# Patient Record
Sex: Female | Born: 1960 | Race: White | Hispanic: No | Smoking: Former smoker
Health system: Southern US, Community
[De-identification: ages and names within clinical notes are randomized; demographics above are authoritative.]

## PROBLEM LIST (undated history)

## (undated) DIAGNOSIS — M419 Scoliosis, unspecified: Secondary | ICD-10-CM

## (undated) DIAGNOSIS — Z973 Presence of spectacles and contact lenses: Secondary | ICD-10-CM

## (undated) DIAGNOSIS — T8859XA Other complications of anesthesia, initial encounter: Secondary | ICD-10-CM

## (undated) DIAGNOSIS — Z860100 Personal history of colon polyps, unspecified: Secondary | ICD-10-CM

## (undated) DIAGNOSIS — T4145XA Adverse effect of unspecified anesthetic, initial encounter: Secondary | ICD-10-CM

## (undated) DIAGNOSIS — H409 Unspecified glaucoma: Secondary | ICD-10-CM

## (undated) DIAGNOSIS — S129XXA Fracture of neck, unspecified, initial encounter: Secondary | ICD-10-CM

## (undated) DIAGNOSIS — D122 Benign neoplasm of ascending colon: Secondary | ICD-10-CM

## (undated) DIAGNOSIS — M25562 Pain in left knee: Secondary | ICD-10-CM

## (undated) DIAGNOSIS — E039 Hypothyroidism, unspecified: Secondary | ICD-10-CM

## (undated) DIAGNOSIS — D649 Anemia, unspecified: Secondary | ICD-10-CM

## (undated) DIAGNOSIS — D125 Benign neoplasm of sigmoid colon: Secondary | ICD-10-CM

## (undated) DIAGNOSIS — Z1211 Encounter for screening for malignant neoplasm of colon: Secondary | ICD-10-CM

## (undated) DIAGNOSIS — M1990 Unspecified osteoarthritis, unspecified site: Secondary | ICD-10-CM

## (undated) DIAGNOSIS — R87610 Atypical squamous cells of undetermined significance on cytologic smear of cervix (ASC-US): Secondary | ICD-10-CM

## (undated) DIAGNOSIS — I341 Nonrheumatic mitral (valve) prolapse: Secondary | ICD-10-CM

## (undated) DIAGNOSIS — K219 Gastro-esophageal reflux disease without esophagitis: Secondary | ICD-10-CM

## (undated) HISTORY — DX: Pain in left knee: M25.562

## (undated) HISTORY — DX: Atypical squamous cells of undetermined significance on cytologic smear of cervix (ASC-US): R87.610

## (undated) HISTORY — DX: Personal history of colon polyps, unspecified: Z86.0100

## (undated) HISTORY — DX: Benign neoplasm of ascending colon: D12.2

## (undated) HISTORY — PX: COLPOSCOPY: SHX161

## (undated) HISTORY — PX: COLONOSCOPY: SHX174

## (undated) HISTORY — DX: Encounter for screening for malignant neoplasm of colon: Z12.11

## (undated) HISTORY — PX: MANDIBLE SURGERY: SHX707

## (undated) HISTORY — DX: Personal history of colonic polyps: Z86.010

## (undated) HISTORY — PX: FOOT SURGERY: SHX648

## (undated) HISTORY — DX: Benign neoplasm of sigmoid colon: D12.5

---
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
## 2005-04-30 ENCOUNTER — Ambulatory Visit: Payer: Self-pay | Admitting: Internal Medicine

## 2005-05-13 ENCOUNTER — Ambulatory Visit: Payer: Self-pay | Admitting: Internal Medicine

## 2005-07-20 ENCOUNTER — Ambulatory Visit: Payer: Self-pay

## 2006-01-29 ENCOUNTER — Ambulatory Visit: Payer: Self-pay | Admitting: Obstetrics and Gynecology

## 2006-05-10 ENCOUNTER — Ambulatory Visit: Payer: Self-pay | Admitting: Obstetrics and Gynecology

## 2007-07-28 ENCOUNTER — Ambulatory Visit: Payer: Self-pay | Admitting: Obstetrics and Gynecology

## 2007-10-13 ENCOUNTER — Ambulatory Visit: Payer: Self-pay | Admitting: Obstetrics and Gynecology

## 2007-10-21 ENCOUNTER — Ambulatory Visit: Payer: Self-pay | Admitting: Obstetrics and Gynecology

## 2008-07-31 ENCOUNTER — Ambulatory Visit: Payer: Self-pay | Admitting: Obstetrics and Gynecology

## 2008-08-07 ENCOUNTER — Ambulatory Visit: Payer: Self-pay | Admitting: Obstetrics and Gynecology

## 2009-08-30 ENCOUNTER — Ambulatory Visit: Payer: Self-pay

## 2010-09-02 ENCOUNTER — Ambulatory Visit: Payer: Self-pay | Admitting: Obstetrics and Gynecology

## 2015-03-08 ENCOUNTER — Other Ambulatory Visit: Payer: Self-pay | Admitting: Obstetrics and Gynecology

## 2015-03-08 DIAGNOSIS — Z13820 Encounter for screening for osteoporosis: Secondary | ICD-10-CM

## 2015-03-08 DIAGNOSIS — Z1231 Encounter for screening mammogram for malignant neoplasm of breast: Secondary | ICD-10-CM

## 2015-08-06 ENCOUNTER — Telehealth: Payer: Self-pay | Admitting: Gastroenterology

## 2015-08-06 NOTE — Telephone Encounter (Signed)
triage

## 2015-08-20 NOTE — Telephone Encounter (Signed)
LVM for pt to return my call.

## 2015-08-23 NOTE — Telephone Encounter (Signed)
No return call to schedule colonoscopy. Mailed letter.

## 2015-08-30 ENCOUNTER — Telehealth: Payer: Self-pay | Admitting: Gastroenterology

## 2015-08-30 NOTE — Telephone Encounter (Signed)
Patient received her letter in the mail for colonoscopy and would like for you to call her back.

## 2015-09-02 NOTE — Telephone Encounter (Signed)
LVM for pt to return my call on pt's cell and home phone.

## 2015-09-03 ENCOUNTER — Other Ambulatory Visit: Payer: Self-pay

## 2015-09-03 ENCOUNTER — Telehealth: Payer: Self-pay

## 2015-09-03 NOTE — Telephone Encounter (Signed)
Pt scheduled for screening colonoscopy at [facility] on 10/14/15. Please check insurance. Thanks!

## 2015-09-03 NOTE — Telephone Encounter (Signed)
Gastroenterology Pre-Procedure Review  Request Date: 10/14/15 Requesting Physician: Dr.   PATIENT REVIEW QUESTIONS: The patient responded to the following health history questions as indicated:    1. Are you having any GI issues? no 2. Do you have a personal history of Polyps?  No 3. Do you have a family history of Colon Cancer or Polyps? yes (Sister, colon mass removed) 4. Diabetes Mellitus? no 5. Joint replacements in the past 12 months?no 6. Major health problems in the past 3 months?no 7. Any artificial heart valves, MVP, or defibrillator?no    MEDICATIONS & ALLERGIES:    Patient reports the following regarding taking any anticoagulation/antiplatelet therapy:   Plavix, Coumadin, Eliquis, Xarelto, Lovenox, Pradaxa, Brilinta, or Effient? no Aspirin? no  Patient confirms/reports the following medications:  No current outpatient prescriptions on file.   No current facility-administered medications for this visit.    Patient confirms/reports the following allergies:  Allergies not on file  No orders of the defined types were placed in this encounter.    AUTHORIZATION INFORMATION Primary Insurance: 1D#: Group #:  Secondary Insurance: 1D#: Group #:  SCHEDULE INFORMATION: Date: 10/14/15 Time: Location: [place]

## 2015-10-11 ENCOUNTER — Encounter: Payer: Self-pay | Admitting: *Deleted

## 2015-10-18 NOTE — Discharge Instructions (Signed)

## 2015-10-21 ENCOUNTER — Encounter
Admission: RE | Disposition: A | Discharge status: Home / Self Care | Payer: Self-pay | Source: Ambulatory Visit | Attending: Gastroenterology

## 2015-10-21 ENCOUNTER — Encounter: Payer: Self-pay | Admitting: *Deleted

## 2015-10-21 ENCOUNTER — Ambulatory Visit
Admission: RE | Admit: 2015-10-21 | Discharge: 2015-10-21 | Disposition: A | Discharge status: Home / Self Care | Payer: BC Managed Care – PPO | Source: Ambulatory Visit | Attending: Gastroenterology | Admitting: Gastroenterology

## 2015-10-21 ENCOUNTER — Ambulatory Visit: Payer: BC Managed Care – PPO | Admitting: Anesthesiology

## 2015-10-21 DIAGNOSIS — Z882 Allergy status to sulfonamides status: Secondary | ICD-10-CM | POA: Diagnosis not present

## 2015-10-21 DIAGNOSIS — Z79899 Other long term (current) drug therapy: Secondary | ICD-10-CM | POA: Diagnosis not present

## 2015-10-21 DIAGNOSIS — Z1211 Encounter for screening for malignant neoplasm of colon: Secondary | ICD-10-CM | POA: Diagnosis not present

## 2015-10-21 DIAGNOSIS — D122 Benign neoplasm of ascending colon: Secondary | ICD-10-CM | POA: Diagnosis not present

## 2015-10-21 DIAGNOSIS — Z7951 Long term (current) use of inhaled steroids: Secondary | ICD-10-CM | POA: Insufficient documentation

## 2015-10-21 DIAGNOSIS — D125 Benign neoplasm of sigmoid colon: Secondary | ICD-10-CM | POA: Diagnosis not present

## 2015-10-21 DIAGNOSIS — F17210 Nicotine dependence, cigarettes, uncomplicated: Secondary | ICD-10-CM | POA: Insufficient documentation

## 2015-10-21 DIAGNOSIS — Z9889 Other specified postprocedural states: Secondary | ICD-10-CM | POA: Insufficient documentation

## 2015-10-21 DIAGNOSIS — Z880 Allergy status to penicillin: Secondary | ICD-10-CM | POA: Insufficient documentation

## 2015-10-21 DIAGNOSIS — M419 Scoliosis, unspecified: Secondary | ICD-10-CM | POA: Insufficient documentation

## 2015-10-21 DIAGNOSIS — K219 Gastro-esophageal reflux disease without esophagitis: Secondary | ICD-10-CM | POA: Insufficient documentation

## 2015-10-21 DIAGNOSIS — M19072 Primary osteoarthritis, left ankle and foot: Secondary | ICD-10-CM | POA: Insufficient documentation

## 2015-10-21 DIAGNOSIS — Z885 Allergy status to narcotic agent status: Secondary | ICD-10-CM | POA: Insufficient documentation

## 2015-10-21 DIAGNOSIS — K641 Second degree hemorrhoids: Secondary | ICD-10-CM | POA: Insufficient documentation

## 2015-10-21 DIAGNOSIS — M19071 Primary osteoarthritis, right ankle and foot: Secondary | ICD-10-CM | POA: Diagnosis not present

## 2015-10-21 DIAGNOSIS — E039 Hypothyroidism, unspecified: Secondary | ICD-10-CM | POA: Diagnosis not present

## 2015-10-21 DIAGNOSIS — I341 Nonrheumatic mitral (valve) prolapse: Secondary | ICD-10-CM | POA: Insufficient documentation

## 2015-10-21 HISTORY — PX: COLONOSCOPY WITH PROPOFOL: SHX5780

## 2015-10-21 HISTORY — DX: Nonrheumatic mitral (valve) prolapse: I34.1

## 2015-10-21 HISTORY — DX: Anemia, unspecified: D64.9

## 2015-10-21 HISTORY — DX: Presence of spectacles and contact lenses: Z97.3

## 2015-10-21 HISTORY — DX: Unspecified osteoarthritis, unspecified site: M19.90

## 2015-10-21 HISTORY — DX: Fracture of neck, unspecified, initial encounter: S12.9XXA

## 2015-10-21 HISTORY — DX: Gastro-esophageal reflux disease without esophagitis: K21.9

## 2015-10-21 HISTORY — PX: POLYPECTOMY: SHX5525

## 2015-10-21 HISTORY — DX: Hypothyroidism, unspecified: E03.9

## 2015-10-21 HISTORY — DX: Scoliosis, unspecified: M41.9

## 2015-10-21 SURGERY — COLONOSCOPY WITH PROPOFOL
Anesthesia: Monitor Anesthesia Care | Wound class: Contaminated

## 2015-10-21 MED ORDER — PROPOFOL 10 MG/ML IV BOLUS
INTRAVENOUS | Status: DC | PRN
  Administered 2015-10-21: 40 mg via INTRAVENOUS
  Administered 2015-10-21: 30 mg via INTRAVENOUS
  Administered 2015-10-21: 50 mg via INTRAVENOUS
  Administered 2015-10-21: 100 mg via INTRAVENOUS
  Administered 2015-10-21: 20 mg via INTRAVENOUS
  Administered 2015-10-21: 40 mg via INTRAVENOUS
  Administered 2015-10-21 (×2): 50 mg via INTRAVENOUS

## 2015-10-21 MED ORDER — ACETAMINOPHEN 325 MG PO TABS: 325.0000 mg | ORAL_TABLET | ORAL | Status: DC | PRN

## 2015-10-21 MED ORDER — LIDOCAINE HCL (CARDIAC) 20 MG/ML IV SOLN
INTRAVENOUS | Status: DC | PRN
  Administered 2015-10-21: 50 mg via INTRAVENOUS

## 2015-10-21 MED ORDER — ACETAMINOPHEN 160 MG/5ML PO SOLN: 325.0000 mg | ORAL | Status: DC | PRN

## 2015-10-21 MED ORDER — LACTATED RINGERS IV SOLN
INTRAVENOUS | Status: DC
  Administered 2015-10-21: 08:00:00 via INTRAVENOUS

## 2015-10-21 MED ORDER — STERILE WATER FOR IRRIGATION IR SOLN
Status: DC | PRN
  Administered 2015-10-21: 09:00:00

## 2015-10-21 MED ORDER — ONDANSETRON HCL 4 MG/2ML IJ SOLN: 4.0000 mg | Freq: Once | INTRAMUSCULAR | Status: DC | PRN

## 2015-10-21 SURGICAL SUPPLY — 28 items
CANISTER SUCT 1200ML W/VALVE (MISCELLANEOUS) ×4 IMPLANT
FCP ESCP3.2XJMB 240X2.8X (MISCELLANEOUS)
FORCEPS BIOP RAD 4 LRG CAP 4 (CUTTING FORCEPS) IMPLANT
FORCEPS BIOP RJ4 240 W/NDL (MISCELLANEOUS)
FORCEPS ESCP3.2XJMB 240X2.8X (MISCELLANEOUS) IMPLANT
GOWN CVR UNV OPN BCK APRN NK (MISCELLANEOUS) ×4 IMPLANT
GOWN ISOL THUMB LOOP REG UNIV (MISCELLANEOUS) ×4
HEMOCLIP INSTINCT (CLIP) IMPLANT
INJECTOR VARIJECT VIN23 (MISCELLANEOUS) IMPLANT
KIT CO2 TUBING (TUBING) IMPLANT
KIT DEFENDO VALVE AND CONN (KITS) IMPLANT
KIT ENDO PROCEDURE OLY (KITS) ×4 IMPLANT
LIGATOR MULTIBAND 6SHOOTER MBL (MISCELLANEOUS) IMPLANT
MARKER SPOT ENDO TATTOO 5ML (MISCELLANEOUS) IMPLANT
PAD GROUND ADULT SPLIT (MISCELLANEOUS) IMPLANT
SNARE SHORT THROW 13M SML OVAL (MISCELLANEOUS) ×4 IMPLANT
SNARE SHORT THROW 30M LRG OVAL (MISCELLANEOUS) IMPLANT
SPOT EX ENDOSCOPIC TATTOO (MISCELLANEOUS)
SUCTION POLY TRAP 4CHAMBER (MISCELLANEOUS) IMPLANT
TRAP SUCTION POLY (MISCELLANEOUS) ×8 IMPLANT
TUBING CONN 6MMX3.1M (TUBING)
TUBING SUCTION CONN 0.25 STRL (TUBING) IMPLANT
UNDERPAD 30X60 958B10 (PK) (MISCELLANEOUS) IMPLANT
VALVE BIOPSY ENDO (VALVE) IMPLANT
VARIJECT INJECTOR VIN23 (MISCELLANEOUS)
WATER AUXILLARY (MISCELLANEOUS) IMPLANT
WATER STERILE IRR 250ML POUR (IV SOLUTION) ×4 IMPLANT
WATER STERILE IRR 500ML POUR (IV SOLUTION) IMPLANT

## 2015-10-21 NOTE — Anesthesia Preprocedure Evaluation (Signed)
Anesthesia Evaluation  Patient identified by MRN, date of birth, ID band Patient awake    Reviewed: Allergy & Precautions, H&P , NPO status   Airway Mallampati: II  TM Distance: >3 FB Neck ROM: full    Dental   Pulmonary Current Smoker,    Pulmonary exam normal        Cardiovascular Normal cardiovascular exam     Neuro/Psych    GI/Hepatic GERD  ,  Endo/Other  Hypothyroidism   Renal/GU      Musculoskeletal   Abdominal   Peds  Hematology   Anesthesia Other Findings   Reproductive/Obstetrics                             Anesthesia Physical Anesthesia Plan  ASA: II  Anesthesia Plan: MAC   Post-op Pain Management:    Induction:   Airway Management Planned:   Additional Equipment:   Intra-op Plan:   Post-operative Plan:   Informed Consent: I have reviewed the patients History and Physical, chart, labs and discussed the procedure including the risks, benefits and alternatives for the proposed anesthesia with the patient or authorized representative who has indicated his/her understanding and acceptance.     Plan Discussed with: CRNA  Anesthesia Plan Comments:         Anesthesia Quick Evaluation

## 2015-10-21 NOTE — Anesthesia Postprocedure Evaluation (Signed)
Anesthesia Post Note  Patient: Brooke Lindsey  Procedure(s) Performed: Procedure(s) (LRB): COLONOSCOPY WITH PROPOFOL (N/A) POLYPECTOMY  Patient location during evaluation: PACU Anesthesia Type: MAC Level of consciousness: awake and alert Pain management: pain level controlled Vital Signs Assessment: post-procedure vital signs reviewed and stable Respiratory status: spontaneous breathing, nonlabored ventilation, respiratory function stable and patient connected to nasal cannula oxygen Cardiovascular status: stable and blood pressure returned to baseline Anesthetic complications: no    Amaryllis Dyke

## 2015-10-21 NOTE — Op Note (Signed)
[facility] Gastroenterology Patient Name: Brooke Lindsey Procedure Date: 10/21/2015 8:35 AM MRN: [id number] Account #: [id number] Date of Birth: 04-26-1961 Admit Type: Outpatient Age: 55 Room: [facility] OR ROOM 01 Gender: Female Note Status: Finalized Procedure:            Colonoscopy Indications:          Screening for colorectal malignant neoplasm Providers:            Lucilla Lame, MD Referring MD:         Lavera Guise, MD (Referring MD) Medicines:            Propofol per Anesthesia Complications:        No immediate complications. Procedure:            Pre-Anesthesia Assessment:                       - Prior to the procedure, a History and Physical was                        performed, and patient medications and allergies were                        reviewed. The patient's tolerance of previous                        anesthesia was also reviewed. The risks and benefits of                        the procedure and the sedation options and risks were                        discussed with the patient. All questions were                        answered, and informed consent was obtained. Prior                        Anticoagulants: The patient has taken no previous                        anticoagulant or antiplatelet agents. ASA Grade                        Assessment: II - A patient with mild systemic disease.                        After reviewing the risks and benefits, the patient was                        deemed in satisfactory condition to undergo the                        procedure.                       After obtaining informed consent, the colonoscope was                        passed under direct vision. Throughout the procedure,  the patient's blood pressure, pulse, and oxygen                        saturations were monitored continuously. The Olympus CF                        H180AL colonoscope (S#: [id number]) was introduced through                     the anus and advanced to the the cecum, identified by                        appendiceal orifice and ileocecal valve. The                        colonoscopy was performed without difficulty. The                        patient tolerated the procedure well. The quality of                        the bowel preparation was excellent. Findings:      The perianal and digital rectal examinations were normal.      A 5 mm polyp was found in the sigmoid colon. The polyp was sessile. The       polyp was removed with a cold biopsy forceps. Resection and retrieval       were complete.      A 7 mm polyp was found in the sigmoid colon. The polyp was sessile. The       polyp was removed with a cold snare. Resection and retrieval were       complete.      A 12 mm polyp was found in the ascending colon. The polyp was sessile.       The polyp was removed with a cold snare. Resection and retrieval were       complete.      Non-bleeding internal hemorrhoids were found during retroflexion. The       hemorrhoids were Grade II (internal hemorrhoids that prolapse but reduce       spontaneously). Impression:           - One 5 mm polyp in the sigmoid colon, removed with a                        cold biopsy forceps. Resected and retrieved.                       - One 7 mm polyp in the sigmoid colon, removed with a                        cold snare. Resected and retrieved.                       - One 12 mm polyp in the ascending colon, removed with                        a cold snare. Resected and retrieved.                       - Non-bleeding internal hemorrhoids. Recommendation:       -  Await pathology results.                       - Repeat colonoscopy in 5 years if polyp adenoma and 10                        years if hyperplastic Procedure Code(s):    --- Professional ---                       [phone], Colonoscopy, flexible; with removal of tumor(s),                        polyp(s), or other  lesion(s) by snare technique                       45380, 14, Colonoscopy, flexible; with biopsy, single                        or multiple Diagnosis Code(s):    --- Professional ---                       Z12.11, Encounter for screening for malignant neoplasm                        of colon                       D12.5, Benign neoplasm of sigmoid colon                       D12.2, Benign neoplasm of ascending colon CPT copyright 2016 American Medical Association. All rights reserved. The codes documented in this report are preliminary and upon coder review may  be revised to meet current compliance requirements. Lucilla Lame, MD 10/21/2015 9:04:06 AM This report has been signed electronically. Number of Addenda: 0 Note Initiated On: 10/21/2015 8:35 AM Scope Withdrawal Time: 0 hours 7 minutes 21 seconds  Total Procedure Duration: 0 hours 12 minutes 43 seconds       [facility]

## 2015-10-21 NOTE — H&P (Signed)
  [facility]  [address], [place], [place] [address] Phone: [phone] Fax : [phone]  Primary Care Physician:  Lavera Guise, MD Primary Gastroenterologist:  Dr. Allen Norris  Pre-Procedure History & Physical: HPI:  Brooke Lindsey is a 55 y.o. female is here for a screening colonoscopy.   Past Medical History  Diagnosis Date  . GERD (gastroesophageal reflux disease)     occasional - OTC meds  . Hypothyroidism     in past - no meds or issues over 10 yrs  . Arthritis     feet  . Compression fracture of C-spine (HCC)     C4-C5 - 30 yrs ago - no limitations or issues  . Scoliosis     mild - lower back  . Anemia     when younger  . Mitral valve prolapse     followed by PCP  . Wears contact lenses     Past Surgical History  Procedure Laterality Date  . Foot surgery    . Colposcopy    . Colonoscopy    . Cesarean section    . Mandible surgery      Prior to Admission medications   Medication Sig Start Date End Date Taking? Authorizing Provider  buPROPion (WELLBUTRIN) 75 MG tablet Take 75 mg by mouth 2 (two) times daily.   Yes Historical Provider, MD  fluticasone (FLONASE) 50 MCG/ACT nasal spray Place 2 sprays into both nostrils daily. 07/20/15  Yes Historical Provider, MD  folic acid (FOLVITE) A999333 MCG tablet Take 400 mcg by mouth 2 (two) times daily.   Yes Historical Provider, MD    Allergies as of 09/03/2015 - never reviewed  Allergen Reaction Noted  . Hydrocodone Nausea And Vomiting 09/03/2015  . Penicillins Hives and Itching 09/03/2015  . Sulfa antibiotics Nausea And Vomiting 09/03/2015    History reviewed. No pertinent family history.  Social History   Social History  . Marital Status: Married    Spouse Name: N/A  . Number of Children: N/A  . Years of Education: N/A   Occupational History  . Not on file.   Social History Main Topics  . Smoking status: Current Some Day Smoker    Types: Cigarettes  . Smokeless tobacco: Not on file   Comment: 1/2 PPWeek, off and on for 35 yrs  . Alcohol Use: 2.4 oz/week    4 Cans of beer per week  . Drug Use: Not on file  . Sexual Activity: Not on file   Other Topics Concern  . Not on file   Social History Narrative    Review of Systems: See HPI, otherwise negative ROS  Physical Exam: BP 99/72 mmHg  Pulse 73  Temp(Src) 97.9 F (36.6 C)  Resp 16  Ht 5\' 1"  (1.549 m)  Wt 124 lb (56.246 kg)  BMI 23.44 kg/m2  SpO2 95% General:   Alert,  pleasant and cooperative in NAD Head:  Normocephalic and atraumatic. Neck:  Supple; no masses or thyromegaly. Lungs:  Clear throughout to auscultation.    Heart:  Regular rate and rhythm. Abdomen:  Soft, nontender and nondistended. Normal bowel sounds, without guarding, and without rebound.   Neurologic:  Alert and  oriented x4;  grossly normal neurologically.  Impression/Plan: Brooke Lindsey is now here to undergo a screening colonoscopy.  Risks, benefits, and alternatives regarding colonoscopy have been reviewed with the patient.  Questions have been answered.  All parties agreeable.

## 2015-10-21 NOTE — Anesthesia Procedure Notes (Signed)
Procedure Name: MAC Date/Time: 10/21/2015 8:45 AM Performed by: Cameron Ali Pre-anesthesia Checklist: Patient identified, Emergency Drugs available, Suction available, Timeout performed and Patient being monitored Patient Re-evaluated:Patient Re-evaluated prior to inductionOxygen Delivery Method: Nasal cannula Placement Confirmation: positive ETCO2

## 2015-10-21 NOTE — Transfer of Care (Signed)
Immediate Anesthesia Transfer of Care Note  Patient: Brooke Lindsey  Procedure(s) Performed: Procedure(s): COLONOSCOPY WITH PROPOFOL (N/A) POLYPECTOMY  Patient Location: PACU  Anesthesia Type: MAC  Level of Consciousness: awake, alert  and patient cooperative  Airway and Oxygen Therapy: Patient Spontanous Breathing and Patient connected to supplemental oxygen  Post-op Assessment: Post-op Vital signs reviewed, Patient's Cardiovascular Status Stable, Respiratory Function Stable, Patent Airway and No signs of Nausea or vomiting  Post-op Vital Signs: Reviewed and stable  Complications: No apparent anesthesia complications

## 2015-10-22 ENCOUNTER — Encounter: Payer: Self-pay | Admitting: Gastroenterology

## 2015-10-28 ENCOUNTER — Encounter: Payer: Self-pay | Admitting: Gastroenterology

## 2016-07-06 ENCOUNTER — Other Ambulatory Visit: Payer: Self-pay | Admitting: Obstetrics and Gynecology

## 2016-07-06 DIAGNOSIS — Z1231 Encounter for screening mammogram for malignant neoplasm of breast: Secondary | ICD-10-CM

## 2017-03-26 ENCOUNTER — Ambulatory Visit (INDEPENDENT_AMBULATORY_CARE_PROVIDER_SITE_OTHER): Payer: BC Managed Care – PPO | Admitting: Family Medicine

## 2017-03-26 ENCOUNTER — Encounter: Payer: Self-pay | Admitting: Family Medicine

## 2017-03-26 VITALS — BP 112/82 | HR 64 | Ht 61.0 in | Wt 124.0 lb

## 2017-03-26 DIAGNOSIS — K429 Umbilical hernia without obstruction or gangrene: Secondary | ICD-10-CM

## 2017-03-26 DIAGNOSIS — Z7689 Persons encountering health services in other specified circumstances: Secondary | ICD-10-CM | POA: Diagnosis not present

## 2017-03-26 NOTE — Patient Instructions (Signed)
Umbilical Hernia, Adult A hernia is a bulge of tissue that pushes through an opening between muscles. An umbilical hernia happens in the abdomen, near the belly button (umbilicus). The hernia may contain tissues from the small intestine, large intestine, or fatty tissue covering the intestines (omentum). Umbilical hernias in adults tend to get worse over time, and they require surgical treatment. There are several types of umbilical hernias. You may have:  A hernia located just above or below the umbilicus (indirect hernia). This is the most common type of umbilical hernia in adults.  A hernia that forms through an opening formed by the umbilicus (direct hernia).  A hernia that comes and goes (reducible hernia). A reducible hernia may be visible only when you strain, lift something heavy, or cough. This type of hernia can be pushed back into the abdomen (reduced).  A hernia that traps abdominal tissue inside the hernia (incarcerated hernia). This type of hernia cannot be reduced.  A hernia that cuts off blood flow to the tissues inside the hernia (strangulated hernia). The tissues can start to die if this happens. This type of hernia requires emergency treatment.  What are the causes? An umbilical hernia happens when tissue inside the abdomen presses on a weak area of the abdominal muscles. What increases the risk? You may have a greater risk of this condition if you:  Are obese.  Have had several pregnancies.  Have a buildup of fluid inside your abdomen (ascites).  Have had surgery that weakens the abdominal muscles.  What are the signs or symptoms? The main symptom of this condition is a painless bulge at or near the belly button. A reducible hernia may be visible only when you strain, lift something heavy, or cough. Other symptoms may include:  Dull pain.  A feeling of pressure.  Symptoms of a strangulated hernia may include:  Pain that gets increasingly worse.  Nausea and  vomiting.  Pain when pressing on the hernia.  Skin over the hernia becoming red or purple.  Constipation.  Blood in the stool.  How is this diagnosed? This condition may be diagnosed based on:  A physical exam. You may be asked to cough or strain while standing. These actions increase the pressure inside your abdomen and force the hernia through the opening in your muscles. Your health care provider may try to reduce the hernia by pressing on it.  Your symptoms and medical history.  How is this treated? Surgery is the only treatment for an umbilical hernia. Surgery for a strangulated hernia is done as soon as possible. If you have a small hernia that is not incarcerated, you may need to lose weight before having surgery. Follow these instructions at home:  Lose weight, if told by your health care provider.  Do not try to push the hernia back in.  Watch your hernia for any changes in color or size. Tell your health care provider if any changes occur.  You may need to avoid activities that increase pressure on your hernia.  Do not lift anything that is heavier than 10 lb (4.5 kg) until your health care provider says that this is safe.  Take over-the-counter and prescription medicines only as told by your health care provider.  Keep all follow-up visits as told by your health care provider. This is important. Contact a health care provider if:  Your hernia gets larger.  Your hernia becomes painful. Get help right away if:  You develop sudden, severe pain near the   area of your hernia.  You have pain as well as nausea or vomiting.  You have pain and the skin over your hernia changes color.  You develop a fever. This information is not intended to replace advice given to you by your health care provider. Make sure you discuss any questions you have with your health care provider. Document Released: 12/27/2015 Document Revised: 03/29/2016 Document Reviewed:  12/27/2015 Elsevier Interactive Patient Education  2018 Elsevier Inc.  

## 2017-03-26 NOTE — Progress Notes (Signed)
Name: Brooke Lindsey   MRN: [id number]    DOB: Dec 25, 1960   Date:03/26/2017       Progress Note  Subjective  Chief Complaint  Chief Complaint  Patient presents with  . Establish Care    Changing PCP due to relocating  . Hernia    lifting boxes- was given diclofenac from next care to rub on abdomen but wanted her to follow up with PCP    Abdominal Pain  This is a new problem. The current episode started in the past 7 days. The onset quality is sudden. The problem occurs daily. The problem has been waxing and waning. The pain is located in the periumbilical region. The pain is mild. The quality of the pain is aching. The abdominal pain does not radiate. Pertinent negatives include no anorexia, arthralgias, belching, constipation, diarrhea, dysuria, fever, flatus, frequency, headaches, hematochezia, hematuria, melena, myalgias, nausea, vomiting or weight loss. Exacerbated by: lifting. Relieved by: ibuprofen. Treatments tried: nsaid. The treatment provided no relief. There is no history of abdominal surgery, colon cancer, Crohn's disease, gallstones, GERD, irritable bowel syndrome, pancreatitis, PUD or ulcerative colitis.    No problem-specific Assessment & Plan notes found for this encounter.   Past Medical History:  Diagnosis Date  . Anemia    when younger  . Arthritis    feet  . Compression fracture of C-spine (HCC)    C4-C5 - 30 yrs ago - no limitations or issues  . GERD (gastroesophageal reflux disease)    occasional - OTC meds  . Hypothyroidism    in past - no meds or issues over 10 yrs  . Mitral valve prolapse    followed by PCP  . Scoliosis    mild - lower back  . Wears contact lenses     Past Surgical History:  Procedure Laterality Date  . CESAREAN SECTION     x 2  . COLONOSCOPY    . COLONOSCOPY WITH PROPOFOL N/A 10/21/2015   Procedure: COLONOSCOPY WITH PROPOFOL;  Surgeon: Lucilla Lame, MD;  Location: [facility];  Service: Endoscopy;  Laterality: N/A;  .  COLPOSCOPY    . FOOT SURGERY    . MANDIBLE SURGERY    . POLYPECTOMY  10/21/2015   Procedure: POLYPECTOMY;  Surgeon: Lucilla Lame, MD;  Location: [place];  Service: Endoscopy;;    Family History  Problem Relation Age of Onset  . COPD Mother   . Stroke Mother   . Cancer Maternal Grandmother   . Heart disease Maternal Grandfather   . Hypertension Maternal Grandfather   . Diabetes Paternal Grandmother   . Heart disease Paternal Grandmother   . Stroke Paternal Grandmother   . Heart disease Paternal Grandfather   . Hypertension Paternal Grandfather     Social History   Social History  . Marital status: Married    Spouse name: N/A  . Number of children: N/A  . Years of education: N/A   Occupational History  . Not on file.   Social History Main Topics  . Smoking status: Former Smoker    Types: Cigarettes  . Smokeless tobacco: Never Used     Comment: 1/2 PPWeek, off and on for 35 yrs  . Alcohol use 2.4 oz/week    4 Cans of beer per week  . Drug use: No  . Sexual activity: Yes   Other Topics Concern  . Not on file   Social History Narrative  . No narrative on file    Allergies  Allergen Reactions  .  Hydrocodone Nausea And Vomiting  . Penicillins Hives and Itching  . Sulfa Antibiotics Nausea And Vomiting    Outpatient Medications Prior to Visit  Medication Sig Dispense Refill  . buPROPion (WELLBUTRIN) 75 MG tablet Take 75 mg by mouth 2 (two) times daily.    . fluticasone (FLONASE) 50 MCG/ACT nasal spray Place 2 sprays into both nostrils daily.  2  . folic acid (FOLVITE) 673 MCG tablet Take 400 mcg by mouth 2 (two) times daily.     No facility-administered medications prior to visit.     Review of Systems  Constitutional: Negative for chills, fever, malaise/fatigue and weight loss.  HENT: Negative for ear discharge, ear pain and sore throat.   Eyes: Negative for blurred vision.  Respiratory: Negative for cough, sputum production, shortness of breath  and wheezing.   Cardiovascular: Negative for chest pain, palpitations and leg swelling.  Gastrointestinal: Positive for abdominal pain. Negative for anorexia, blood in stool, constipation, diarrhea, flatus, heartburn, hematochezia, melena, nausea and vomiting.  Genitourinary: Negative for dysuria, frequency, hematuria and urgency.  Musculoskeletal: Negative for arthralgias, back pain, joint pain, myalgias and neck pain.  Skin: Negative for rash.  Neurological: Negative for dizziness, tingling, sensory change, focal weakness and headaches.  Endo/Heme/Allergies: Negative for environmental allergies and polydipsia. Does not bruise/bleed easily.  Psychiatric/Behavioral: Negative for depression and suicidal ideas. The patient is not nervous/anxious and does not have insomnia.      Objective  Vitals:   03/26/17 1530  BP: 112/82  Pulse: 64  Weight: 124 lb (56.2 kg)  Height: 5\' 1"  (1.549 m)    Physical Exam  Constitutional: She is well-developed, well-nourished, and in no distress. No distress.  HENT:  Head: Normocephalic and atraumatic.  Right Ear: External ear normal.  Left Ear: External ear normal.  Nose: Nose normal.  Mouth/Throat: Oropharynx is clear and moist.  Eyes: Pupils are equal, round, and reactive to light. Conjunctivae and EOM are normal. Right eye exhibits no discharge. Left eye exhibits no discharge.  Neck: Normal range of motion. Neck supple. No JVD present. No thyromegaly present.  Cardiovascular: Normal rate, regular rhythm, normal heart sounds and intact distal pulses.  Exam reveals no gallop and no friction rub.   No murmur heard. Pulmonary/Chest: Effort normal and breath sounds normal. She has no wheezes. She has no rales.  Abdominal: Soft. Bowel sounds are normal. She exhibits no mass. There is tenderness in the periumbilical area. There is no rebound and no guarding.    Musculoskeletal: Normal range of motion. She exhibits no edema.  Lymphadenopathy:    She has  no cervical adenopathy.  Neurological: She is alert. She has normal reflexes.  Skin: Skin is warm and dry. She is not diaphoretic.  Psychiatric: Mood and affect normal.  Nursing note and vitals reviewed.     Assessment & Plan  Problem List Items Addressed This Visit    None    Visit Diagnoses    Establishing care with new doctor, encounter for    -  Primary   Umbilical hernia without obstruction and without gangrene       Relevant Orders   Ambulatory referral to General Surgery      No orders of the defined types were placed in this encounter.     Dr. Mac[facility]  03/26/17

## 2017-04-13 ENCOUNTER — Ambulatory Visit (INDEPENDENT_AMBULATORY_CARE_PROVIDER_SITE_OTHER): Payer: BC Managed Care – PPO | Admitting: Family Medicine

## 2017-04-13 ENCOUNTER — Encounter: Payer: Self-pay | Admitting: Family Medicine

## 2017-04-13 ENCOUNTER — Ambulatory Visit (INDEPENDENT_AMBULATORY_CARE_PROVIDER_SITE_OTHER): Payer: BC Managed Care – PPO | Admitting: Surgery

## 2017-04-13 ENCOUNTER — Encounter: Payer: Self-pay | Admitting: Surgery

## 2017-04-13 VITALS — BP 124/80 | HR 64 | Ht 61.0 in | Wt 126.0 lb

## 2017-04-13 VITALS — BP 147/89 | HR 61 | Temp 98.0°F | Ht 61.0 in | Wt 126.0 lb

## 2017-04-13 DIAGNOSIS — K429 Umbilical hernia without obstruction or gangrene: Secondary | ICD-10-CM | POA: Diagnosis not present

## 2017-04-13 DIAGNOSIS — L03011 Cellulitis of right finger: Secondary | ICD-10-CM | POA: Diagnosis not present

## 2017-04-13 DIAGNOSIS — K219 Gastro-esophageal reflux disease without esophagitis: Secondary | ICD-10-CM | POA: Insufficient documentation

## 2017-04-13 DIAGNOSIS — Z23 Encounter for immunization: Secondary | ICD-10-CM | POA: Diagnosis not present

## 2017-04-13 DIAGNOSIS — M1990 Unspecified osteoarthritis, unspecified site: Secondary | ICD-10-CM | POA: Insufficient documentation

## 2017-04-13 DIAGNOSIS — I341 Nonrheumatic mitral (valve) prolapse: Secondary | ICD-10-CM | POA: Insufficient documentation

## 2017-04-13 DIAGNOSIS — E039 Hypothyroidism, unspecified: Secondary | ICD-10-CM | POA: Insufficient documentation

## 2017-04-13 DIAGNOSIS — D649 Anemia, unspecified: Secondary | ICD-10-CM | POA: Insufficient documentation

## 2017-04-13 DIAGNOSIS — S129XXA Fracture of neck, unspecified, initial encounter: Secondary | ICD-10-CM | POA: Insufficient documentation

## 2017-04-13 DIAGNOSIS — M419 Scoliosis, unspecified: Secondary | ICD-10-CM | POA: Insufficient documentation

## 2017-04-13 MED ORDER — CEPHALEXIN 500 MG PO CAPS: 500.0000 mg | ORAL_CAPSULE | Freq: Four times a day (QID) | ORAL | 1 refills | Status: DC

## 2017-04-13 MED ORDER — MUPIROCIN CALCIUM 2 % EX CREA: 1.0000 "application " | TOPICAL_CREAM | Freq: Two times a day (BID) | CUTANEOUS | 1 refills | Status: DC

## 2017-04-13 NOTE — Progress Notes (Signed)
Surgical Clinic History and Physical  Referring provider:  Juline Patch, MD [address] [place], [place] [address]  HISTORY OF PRESENT ILLNESS (HPI):  56 y.o. female presents for evaluation of a recently diagnosed symptomatic reducible umbilical hernia, at which she first noticed a "bulge" and pain 2 weeks ago after she lifted heavy boxes down from an upper shelf. Patient reports that painful bulge remained x 1 week, since which the bulge and pain have both resolved. Patient denies having recognized similar prior to this recent episode, denies constipation/straining with BM's, increased coughing, or coughing blood. Patient otherwise reports routine flatus and daily formed BM's, denies any abdominal pain, N/V, fever/chills, CP, or SOB.  PAST MEDICAL HISTORY (PMH):  Past Medical History:  Diagnosis Date  . Anemia    when younger  . Arthritis    feet  . Benign neoplasm of ascending colon   . Benign neoplasm of sigmoid colon   . Compression fracture of C-spine (HCC)    C4-C5 - 30 yrs ago - no limitations or issues  . GERD (gastroesophageal reflux disease)    occasional - OTC meds  . Hypothyroidism    in past - no meds or issues over 10 yrs  . Mitral valve prolapse    followed by PCP  . Scoliosis    mild - lower back  . Special screening for malignant neoplasms, colon   . Wears contact lenses      PAST SURGICAL HISTORY ([place]):  Past Surgical History:  Procedure Laterality Date  . CESAREAN SECTION     x 2  . COLONOSCOPY    . COLONOSCOPY WITH PROPOFOL N/A 10/21/2015   Procedure: COLONOSCOPY WITH PROPOFOL;  Surgeon: Lucilla Lame, MD;  Location: [facility];  Service: Endoscopy;  Laterality: N/A;  . COLPOSCOPY    . FOOT SURGERY    . MANDIBLE SURGERY    . POLYPECTOMY  10/21/2015   Procedure: POLYPECTOMY;  Surgeon: Lucilla Lame, MD;  Location: [place];  Service: Endoscopy;;     MEDICATIONS:  Prior to Admission medications   Not on File      ALLERGIES:  Allergies  Allergen Reactions  . Hydrocodone Nausea And Vomiting  . Penicillins Hives and Itching  . Sulfa Antibiotics Nausea And Vomiting     SOCIAL HISTORY:  Social History   Social History  . Marital status: Married    Spouse name: N/A  . Number of children: N/A  . Years of education: N/A   Occupational History  . Not on file.   Social History Main Topics  . Smoking status: Former Smoker    Types: Cigarettes    Quit date: 04/14/2015  . Smokeless tobacco: Never Used     Comment: 1/2 PPWeek, off and on for 35 yrs  . Alcohol use Yes     Comment: 1 Bottle of Wine Weekly  . Drug use: No  . Sexual activity: Yes   Other Topics Concern  . Not on file   Social History Narrative  . No narrative on file    The patient currently resides (home / rehab facility / nursing home): Home  The patient normally is (ambulatory / bedbound): Ambulatory   FAMILY HISTORY:  Family History  Problem Relation Age of Onset  . COPD Mother   . Stroke Mother   . Cancer Maternal Grandmother   . Heart disease Maternal Grandfather   . Hypertension Maternal Grandfather   . Diabetes Paternal Grandmother   . Heart disease  Paternal Grandmother   . Stroke Paternal Grandmother   . Heart disease Paternal Grandfather   . Hypertension Paternal Grandfather     Otherwise negative/non-contributory.  REVIEW OF SYSTEMS:  Constitutional: denies any other weight loss, fever, chills, or sweats  Eyes: denies any other vision changes, history of eye injury  ENT: denies sore throat, hearing problems  Respiratory: denies shortness of breath, wheezing  Cardiovascular: denies chest pain, palpitations  Gastrointestinal: abdominal pain, N/V, and bowel function as per HPI Musculoskeletal: denies any other joint pains or cramps  Skin: Denies any other rashes or skin discolorations  Neurological: denies any other headache, dizziness, weakness  Psychiatric: Denies any other depression, anxiety    All other review of systems were otherwise negative   VITAL SIGNS:  @VSRANGES @     Height: 5\' 1"  (154.9 cm) Weight: 126 lb (57.2 kg) BMI (Calculated): 23.82   PHYSICAL EXAM:  Constitutional:  -- Normal body habitus  -- Awake, alert, and oriented x3  Eyes:  -- Pupils equally round and reactive to light  -- No scleral icterus  Ear, nose, throat:  -- No jugular venous distension -- No nasal drainage, bleeding Pulmonary:  -- No crackles  -- Equal breath sounds bilaterally -- Breathing non-labored at rest Cardiovascular:  -- S1, S2 present  -- No pericardial rubs  Gastrointestinal:  -- Abdomen soft, nontender, nondistended, no guarding/rebound -- Small ~1 cm umbilical/supra-umbilical defect associated with self-reducing hernia only with provocation -- No other abdominal masses appreciated, pulsatile or otherwise  Musculoskeletal and Integumentary:  -- Wounds or skin discoloration: None appreciated -- Extremities: B/L UE and LE FROM, hands and feet warm, no edema  Neurologic:  -- Motor function: Intact and symmetric -- Sensation: Intact and symmetric  Assessment/Plan:  56 y.o. female with recently diagnosed easily reducible and recently (no-longer) symptomatic umbilical hernia, complicated by co-morbidities including mitral valve prolapse, hypothyroidism, GERD, colon polyps, and osteoarthritis.   - signs/symptoms of incarceration and obstruction discussed   - strategies for self-reduction of patient's umbilical hernia were also reviewed  - as hernia is no longer symptomatic, patient wishes not to consider surgery at this time  - all of patient's questions were answered to her expressed satsifaction  - return to clinic as needed, instructed to call if questions/concerns  All of the above recommendations were discussed with the patient, and all of patient's questions were answered to her expressed satisfaction.  Thank you for the opportunity to participate in this patient's  care.  -- Marilynne Drivers Rosana Hoes, MD, [place]: [facility] - Partnering for exceptional care. Office: [phone]

## 2017-04-13 NOTE — Progress Notes (Signed)
Name: Batoul Limes   MRN: [id number]    DOB: 09/06/1960   Date:04/13/2017       Progress Note  Subjective  Chief Complaint  Chief Complaint  Patient presents with  . Hand Pain    R) pointer finger- pulled splinter out from under nail x 2 days ago. Started Monday with throbbing and redness around finger tip/ nail    Hand Injury   The incident occurred 3 to 5 days ago. The incident occurred at home. Injury mechanism: splinter. The pain is present in the right fingers. The quality of the pain is described as aching. The pain is mild. The pain has been fluctuating since the incident. Pertinent negatives include no chest pain, muscle weakness, numbness or tingling. The symptoms are aggravated by a foreign body (splinter remove).    No problem-specific Assessment & Plan notes found for this encounter.   Past Medical History:  Diagnosis Date  . Anemia    when younger  . Arthritis    feet  . Benign neoplasm of ascending colon   . Benign neoplasm of sigmoid colon   . Compression fracture of C-spine (HCC)    C4-C5 - 30 yrs ago - no limitations or issues  . GERD (gastroesophageal reflux disease)    occasional - OTC meds  . Hypothyroidism    in past - no meds or issues over 10 yrs  . Mitral valve prolapse    followed by PCP  . Scoliosis    mild - lower back  . Special screening for malignant neoplasms, colon   . Wears contact lenses     Past Surgical History:  Procedure Laterality Date  . CESAREAN SECTION     x 2  . COLONOSCOPY    . COLONOSCOPY WITH PROPOFOL N/A 10/21/2015   Procedure: COLONOSCOPY WITH PROPOFOL;  Surgeon: Lucilla Lame, MD;  Location: [facility];  Service: Endoscopy;  Laterality: N/A;  . COLPOSCOPY    . FOOT SURGERY    . MANDIBLE SURGERY    . POLYPECTOMY  10/21/2015   Procedure: POLYPECTOMY;  Surgeon: Lucilla Lame, MD;  Location: [facility];  Service: Endoscopy;;    Family History  Problem Relation Age of Onset  . COPD Mother   . Stroke Mother    . Cancer Maternal Grandmother   . Heart disease Maternal Grandfather   . Hypertension Maternal Grandfather   . Diabetes Paternal Grandmother   . Heart disease Paternal Grandmother   . Stroke Paternal Grandmother   . Heart disease Paternal Grandfather   . Hypertension Paternal Grandfather     Social History   Social History  . Marital status: Married    Spouse name: N/A  . Number of children: N/A  . Years of education: N/A   Occupational History  . Not on file.   Social History Main Topics  . Smoking status: Former Smoker    Types: Cigarettes    Quit date: 04/14/2015  . Smokeless tobacco: Never Used     Comment: 1/2 PPWeek, off and on for 35 yrs  . Alcohol use Yes     Comment: 1 Bottle of Wine Weekly  . Drug use: No  . Sexual activity: Yes   Other Topics Concern  . Not on file   Social History Narrative  . No narrative on file    Allergies  Allergen Reactions  . Hydrocodone Nausea And Vomiting  . Penicillins Hives and Itching  . Sulfa Antibiotics Nausea And Vomiting    No  outpatient prescriptions prior to visit.   No facility-administered medications prior to visit.     Review of Systems  Constitutional: Negative for chills, fever, malaise/fatigue and weight loss.  HENT: Negative for ear discharge, ear pain and sore throat.   Eyes: Negative for blurred vision.  Respiratory: Negative for cough, sputum production, shortness of breath and wheezing.   Cardiovascular: Negative for chest pain, palpitations and leg swelling.  Gastrointestinal: Negative for abdominal pain, blood in stool, constipation, diarrhea, heartburn, melena and nausea.  Genitourinary: Negative for dysuria, frequency, hematuria and urgency.  Musculoskeletal: Negative for back pain, joint pain, myalgias and neck pain.  Skin: Negative for rash.  Neurological: Negative for dizziness, tingling, sensory change, focal weakness, numbness and headaches.  Endo/Heme/Allergies: Negative for  environmental allergies and polydipsia. Does not bruise/bleed easily.  Psychiatric/Behavioral: Negative for depression and suicidal ideas. The patient is not nervous/anxious and does not have insomnia.      Objective  Vitals:   04/13/17 1520  BP: 124/80  Pulse: 64  Weight: 126 lb (57.2 kg)  Height: 5\' 1"  (1.549 m)    Physical Exam  Constitutional: She is well-developed, well-nourished, and in no distress. No distress.  HENT:  Head: Normocephalic and atraumatic.  Right Ear: External ear normal.  Left Ear: External ear normal.  Nose: Nose normal.  Mouth/Throat: Oropharynx is clear and moist.  Eyes: Pupils are equal, round, and reactive to light. Conjunctivae and EOM are normal. Right eye exhibits no discharge. Left eye exhibits no discharge.  Neck: Normal range of motion. Neck supple. No JVD present. No thyromegaly present.  Cardiovascular: Normal rate, regular rhythm, normal heart sounds and intact distal pulses.  Exam reveals no gallop and no friction rub.   No murmur heard. Pulmonary/Chest: Effort normal and breath sounds normal. She has no wheezes. She has no rales.  Abdominal: Soft. Bowel sounds are normal. She exhibits no mass. There is no tenderness. There is no guarding.  Musculoskeletal: Normal range of motion. She exhibits no edema.  Lymphadenopathy:    She has no cervical adenopathy.  Neurological: She is alert. She has normal reflexes.  Skin: Skin is warm and dry. She is not diaphoretic.  Psychiatric: Mood and affect normal.  Nursing note and vitals reviewed.     Assessment & Plan  Problem List Items Addressed This Visit    None    Visit Diagnoses    Paronychia of finger, right    -  Primary   Relevant Medications   mupirocin cream (BACTROBAN) 2 %   cephALEXin (KEFLEX) 500 MG capsule   Other Relevant Orders   Tdap vaccine greater than or equal to 7yo IM (Completed)   Need for diphtheria-tetanus-pertussis (Tdap) vaccine       Relevant Orders   Tdap  vaccine greater than or equal to 7yo IM (Completed)      Meds ordered this encounter  Medications  . mupirocin cream (BACTROBAN) 2 %    Sig: Apply 1 application topically 2 (two) times daily.    Dispense:  15 g    Refill:  1  . cephALEXin (KEFLEX) 500 MG capsule    Sig: Take 1 capsule (500 mg total) by mouth 4 (four) times daily.    Dispense:  28 capsule    Refill:  1      Dr. Otilio Miu [facility]  04/13/17

## 2017-04-13 NOTE — Patient Instructions (Signed)
Paronychia Paronychia is an infection of the skin that surrounds a nail. It usually affects the skin around a fingernail, but it may also occur near a toenail. It often causes pain and swelling around the nail. This condition may come on suddenly or develop over a longer period. In some cases, a collection of pus (abscess) can form near or under the nail. Usually, paronychia is not serious and it clears up with treatment. What are the causes? This condition may be caused by bacteria or fungi. It is commonly caused by either Streptococcus or Staphylococcus bacteria. The bacteria or fungi often cause the infection by getting into the affected area through an opening in the skin, such as a cut or a hangnail. What increases the risk? This condition is more likely to develop in:  People who get their hands wet often, such as those who work as Designer, industrial/product, bartenders, or nurses.  People who bite their fingernails or suck their thumbs.  People who trim their nails too short.  People who have hangnails or injured fingertips.  People who get manicures.  People who have diabetes.  What are the signs or symptoms? Symptoms of this condition include:  Redness and swelling of the skin near the nail.  Tenderness around the nail when you touch the area.  Pus-filled bumps under the cuticle. The cuticle is the skin at the base or sides of the nail.  Fluid or pus under the nail.  Throbbing pain in the area.  How is this diagnosed? This condition is usually diagnosed with a physical exam. In some cases, a sample of pus may be taken from an abscess to be tested in a lab. This can help to determine what type of bacteria or fungi is causing the condition. How is this treated? Treatment for this condition depends on the cause and severity of the condition. If the condition is mild, it may clear up on its own in a few days. Your health care provider may recommend soaking the affected area in warm water a  few times a day. When treatment is needed, the options may include:  Antibiotic medicine, if the condition is caused by a bacterial infection.  Antifungal medicine, if the condition is caused by a fungal infection.  Incision and drainage, if an abscess is present. In this procedure, the health care provider will cut open the abscess so the pus can drain out.  Follow these instructions at home:  Soak the affected area in warm water if directed to do so by your health care provider. You may be told to do this for 20 minutes, 2-3 times a day. Keep the area dry in between soakings.  Take medicines only as directed by your health care provider.  If you were prescribed an antibiotic medicine, finish all of it even if you start to feel better.  Keep the affected area clean.  Do not try to drain a fluid-filled bump yourself.  If you will be washing dishes or performing other tasks that require your hands to get wet, wear rubber gloves. You should also wear gloves if your hands might come in contact with irritating substances, such as cleaners or chemicals.  Follow your health care provider's instructions about: ? Wound care. ? Bandage (dressing) changes and removal. Contact a health care provider if:  Your symptoms get worse or do not improve with treatment.  You have a fever or chills.  You have redness spreading from the affected area.  You have continued  or increased fluid, blood, or pus coming from the affected area.  Your finger or knuckle becomes swollen or is difficult to move. This information is not intended to replace advice given to you by your health care provider. Make sure you discuss any questions you have with your health care provider. Document Released: 01/20/2001 Document Revised: 01/02/2016 Document Reviewed: 07/04/2014 Elsevier Interactive Patient Education  2018 Elsevier Inc.  

## 2017-04-13 NOTE — Patient Instructions (Signed)
Please call with any questions or concerns that you may have.

## 2017-04-20 ENCOUNTER — Other Ambulatory Visit: Payer: Self-pay

## 2017-04-20 DIAGNOSIS — L089 Local infection of the skin and subcutaneous tissue, unspecified: Secondary | ICD-10-CM

## 2017-04-20 DIAGNOSIS — S60419D Abrasion of unspecified finger, subsequent encounter: Principal | ICD-10-CM

## 2017-04-20 MED ORDER — DOXYCYCLINE HYCLATE 100 MG PO TABS: 100.0000 mg | ORAL_TABLET | Freq: Two times a day (BID) | ORAL | 0 refills | Status: DC

## 2017-05-14 ENCOUNTER — Ambulatory Visit: Payer: BC Managed Care – PPO | Admitting: Family Medicine

## 2017-06-03 ENCOUNTER — Telehealth: Payer: Self-pay

## 2017-06-03 ENCOUNTER — Other Ambulatory Visit: Payer: Self-pay

## 2017-06-03 NOTE — Telephone Encounter (Signed)
Pt had flu shot at [facility]

## 2017-06-10 ENCOUNTER — Other Ambulatory Visit: Payer: Self-pay

## 2017-06-10 ENCOUNTER — Encounter: Payer: Self-pay | Admitting: Family Medicine

## 2017-06-10 ENCOUNTER — Ambulatory Visit (INDEPENDENT_AMBULATORY_CARE_PROVIDER_SITE_OTHER): Payer: BC Managed Care – PPO | Admitting: Family Medicine

## 2017-06-10 VITALS — BP 100/64 | HR 60 | Ht 61.0 in | Wt 126.0 lb

## 2017-06-10 DIAGNOSIS — J0100 Acute maxillary sinusitis, unspecified: Secondary | ICD-10-CM

## 2017-06-10 DIAGNOSIS — J40 Bronchitis, not specified as acute or chronic: Secondary | ICD-10-CM

## 2017-06-10 MED ORDER — BENZONATATE 100 MG PO CAPS: 100.0000 mg | ORAL_CAPSULE | Freq: Two times a day (BID) | ORAL | 0 refills | Status: DC | PRN

## 2017-06-10 MED ORDER — AZITHROMYCIN 250 MG PO TABS: ORAL_TABLET | ORAL | 1 refills | Status: DC

## 2017-06-10 NOTE — Progress Notes (Signed)
Name: Brooke Lindsey   MRN: [id number]    DOB: 02/28/1961   Date:06/10/2017       Progress Note  Subjective  Chief Complaint  Chief Complaint  Patient presents with  . Sinusitis    cough and cong, sore throat, L) ear pressure    Sinusitis  This is a new problem. The current episode started in the past 7 days. The problem has been gradually worsening since onset. There has been no fever. The pain is mild. Associated symptoms include coughing, headaches, sinus pressure and a sore throat. Pertinent negatives include no chills, congestion, diaphoresis, ear pain, hoarse voice, neck pain, shortness of breath, sneezing or swollen glands. Past treatments include acetaminophen. The treatment provided mild relief.    No problem-specific Assessment & Plan notes found for this encounter.   Past Medical History:  Diagnosis Date  . Anemia    when younger  . Arthritis    feet  . Benign neoplasm of ascending colon   . Benign neoplasm of sigmoid colon   . Compression fracture of C-spine (HCC)    C4-C5 - 30 yrs ago - no limitations or issues  . GERD (gastroesophageal reflux disease)    occasional - OTC meds  . Hypothyroidism    in past - no meds or issues over 10 yrs  . Mitral valve prolapse    followed by PCP  . Scoliosis    mild - lower back  . Special screening for malignant neoplasms, colon   . Wears contact lenses     Past Surgical History:  Procedure Laterality Date  . CESAREAN SECTION     x 2  . COLONOSCOPY    . COLONOSCOPY WITH PROPOFOL N/A 10/21/2015   Procedure: COLONOSCOPY WITH PROPOFOL;  Surgeon: Lucilla Lame, MD;  Location: [facility];  Service: Endoscopy;  Laterality: N/A;  . COLPOSCOPY    . FOOT SURGERY    . MANDIBLE SURGERY    . POLYPECTOMY  10/21/2015   Procedure: POLYPECTOMY;  Surgeon: Lucilla Lame, MD;  Location: [facility];  Service: Endoscopy;;    Family History  Problem Relation Age of Onset  . COPD Mother   . Stroke Mother   . Cancer Maternal  Grandmother   . Heart disease Maternal Grandfather   . Hypertension Maternal Grandfather   . Diabetes Paternal Grandmother   . Heart disease Paternal Grandmother   . Stroke Paternal Grandmother   . Heart disease Paternal Grandfather   . Hypertension Paternal Grandfather     Social History   Social History  . Marital status: Married    Spouse name: N/A  . Number of children: N/A  . Years of education: N/A   Occupational History  . Not on file.   Social History Main Topics  . Smoking status: Former Smoker    Types: Cigarettes    Quit date: 04/14/2015  . Smokeless tobacco: Never Used     Comment: 1/2 PPWeek, off and on for 35 yrs  . Alcohol use Yes     Comment: 1 Bottle of Wine Weekly  . Drug use: No  . Sexual activity: Yes   Other Topics Concern  . Not on file   Social History Narrative  . No narrative on file    Allergies  Allergen Reactions  . Hydrocodone Nausea And Vomiting  . Penicillins Hives and Itching  . Sulfa Antibiotics Nausea And Vomiting    Outpatient Medications Prior to Visit  Medication Sig Dispense Refill  . doxycycline (VIBRA-TABS)  100 MG tablet Take 1 tablet (100 mg total) by mouth 2 (two) times daily. 20 tablet 0  . mupirocin cream (BACTROBAN) 2 % Apply 1 application topically 2 (two) times daily. 15 g 1   No facility-administered medications prior to visit.     Review of Systems  Constitutional: Negative for chills, diaphoresis, fever, malaise/fatigue and weight loss.  HENT: Positive for sinus pressure and sore throat. Negative for congestion, ear discharge, ear pain, hoarse voice and sneezing.   Eyes: Negative for blurred vision.  Respiratory: Positive for cough. Negative for sputum production, shortness of breath and wheezing.   Cardiovascular: Negative for chest pain, palpitations and leg swelling.  Gastrointestinal: Negative for abdominal pain, blood in stool, constipation, diarrhea, heartburn, melena and nausea.  Genitourinary:  Negative for dysuria, frequency, hematuria and urgency.  Musculoskeletal: Negative for back pain, joint pain, myalgias and neck pain.  Skin: Negative for rash.  Neurological: Positive for headaches. Negative for dizziness, tingling, sensory change and focal weakness.  Endo/Heme/Allergies: Negative for environmental allergies and polydipsia. Does not bruise/bleed easily.  Psychiatric/Behavioral: Negative for depression and suicidal ideas. The patient is not nervous/anxious and does not have insomnia.      Objective  Vitals:   06/10/17 1457  BP: 100/64  Pulse: 60  Weight: 126 lb (57.2 kg)  Height: 5\' 1"  (1.549 m)    Physical Exam  Constitutional: She is well-developed, well-nourished, and in no distress. No distress.  HENT:  Head: Normocephalic and atraumatic.  Right Ear: External ear normal.  Left Ear: External ear normal.  Nose: Right sinus exhibits maxillary sinus tenderness. Left sinus exhibits maxillary sinus tenderness.  Mouth/Throat: Oropharynx is clear and moist.  Eyes: Pupils are equal, round, and reactive to light. Conjunctivae and EOM are normal. Right eye exhibits no discharge. Left eye exhibits no discharge.  Neck: Normal range of motion. Neck supple. No JVD present. No thyromegaly present.  Cardiovascular: Normal rate, regular rhythm, normal heart sounds and intact distal pulses.  Exam reveals no gallop and no friction rub.   No murmur heard. Pulmonary/Chest: Effort normal and breath sounds normal. She has no wheezes. She has no rales.  Abdominal: Soft. Bowel sounds are normal. She exhibits no mass. There is no tenderness. There is no guarding.  Musculoskeletal: Normal range of motion. She exhibits no edema.  Lymphadenopathy:       Head (right side): Submandibular adenopathy present.       Head (left side): Submandibular adenopathy present.    She has no cervical adenopathy.  Neurological: She is alert.  Skin: Skin is warm and dry. She is not diaphoretic.   Psychiatric: Mood and affect normal.  Nursing note and vitals reviewed.     Assessment & Plan  Problem List Items Addressed This Visit    None    Visit Diagnoses    Acute non-recurrent maxillary sinusitis    -  Primary   Relevant Medications   azithromycin (ZITHROMAX) 250 MG tablet   benzonatate (TESSALON) 100 MG capsule   Bronchitis       Relevant Medications   azithromycin (ZITHROMAX) 250 MG tablet   benzonatate (TESSALON) 100 MG capsule      Meds ordered this encounter  Medications  . azithromycin (ZITHROMAX) 250 MG tablet    Sig: 2 today then 1 a day for 4 days    Dispense:  6 tablet    Refill:  1  . benzonatate (TESSALON) 100 MG capsule    Sig: Take 1 capsule (100 mg total)  by mouth 2 (two) times daily as needed for cough.    Dispense:  20 capsule    Refill:  0      Dr. Otilio Miu [facility]  06/10/17

## 2017-08-23 ENCOUNTER — Ambulatory Visit: Payer: BC Managed Care – PPO | Admitting: Family Medicine

## 2017-08-23 ENCOUNTER — Encounter: Payer: Self-pay | Admitting: Family Medicine

## 2017-08-23 VITALS — BP 120/80 | HR 60 | Ht 61.0 in | Wt 127.0 lb

## 2017-08-23 DIAGNOSIS — J40 Bronchitis, not specified as acute or chronic: Secondary | ICD-10-CM | POA: Diagnosis not present

## 2017-08-23 DIAGNOSIS — J0100 Acute maxillary sinusitis, unspecified: Secondary | ICD-10-CM | POA: Diagnosis not present

## 2017-08-23 MED ORDER — AZITHROMYCIN 250 MG PO TABS: ORAL_TABLET | ORAL | 1 refills | Status: DC

## 2017-08-23 NOTE — Progress Notes (Signed)
Name: Brooke Lindsey   MRN: [id number]    DOB: [birth year]   Date:08/23/2017       Progress Note  Subjective  Chief Complaint  Chief Complaint  Patient presents with  . Sinusitis    Sinusitis  This is a new problem. The current episode started in the past 7 days. The problem has been gradually worsening since onset. There has been no fever. Associated symptoms include congestion, coughing, ear pain, sinus pressure and a sore throat. Pertinent negatives include no chills, diaphoresis, headaches, hoarse voice, neck pain, shortness of breath, sneezing or swollen glands. Past treatments include oral decongestants.    No problem-specific Assessment & Plan notes found for this encounter.   Past Medical History:  Diagnosis Date  . Anemia    when younger  . Arthritis    feet  . Benign neoplasm of ascending colon   . Benign neoplasm of sigmoid colon   . Compression fracture of C-spine (HCC)    C4-C5 - 30 yrs ago - no limitations or issues  . GERD (gastroesophageal reflux disease)    occasional - OTC meds  . Hypothyroidism    in past - no meds or issues over 10 yrs  . Mitral valve prolapse    followed by PCP  . Scoliosis    mild - lower back  . Special screening for malignant neoplasms, colon   . Wears contact lenses     Past Surgical History:  Procedure Laterality Date  . CESAREAN SECTION     x 2  . COLONOSCOPY    . COLONOSCOPY WITH PROPOFOL N/A 10/21/2015   Procedure: COLONOSCOPY WITH PROPOFOL;  Surgeon: Lucilla Lame, MD;  Location: [facility];  Service: Endoscopy;  Laterality: N/A;  . COLPOSCOPY    . FOOT SURGERY    . MANDIBLE SURGERY    . POLYPECTOMY  10/21/2015   Procedure: POLYPECTOMY;  Surgeon: Lucilla Lame, MD;  Location: [facility];  Service: Endoscopy;;    Family History  Problem Relation Age of Onset  . COPD Mother   . Stroke Mother   . Cancer Maternal Grandmother   . Heart disease Maternal Grandfather   . Hypertension Maternal Grandfather   .  Diabetes Paternal Grandmother   . Heart disease Paternal Grandmother   . Stroke Paternal Grandmother   . Heart disease Paternal Grandfather   . Hypertension Paternal Grandfather     Social History   Socioeconomic History  . Marital status: Married    Spouse name: Not on file  . Number of children: Not on file  . Years of education: Not on file  . Highest education level: Not on file  Social Needs  . Financial resource strain: Not on file  . Food insecurity - worry: Not on file  . Food insecurity - inability: Not on file  . Transportation needs - medical: Not on file  . Transportation needs - non-medical: Not on file  Occupational History  . Not on file  Tobacco Use  . Smoking status: Former Smoker    Types: Cigarettes    Last attempt to quit: 04/14/2015    Years since quitting: 2.3  . Smokeless tobacco: Never Used  . Tobacco comment: 1/2 PPWeek, off and on for 35 yrs  Substance and Sexual Activity  . Alcohol use: Yes    Comment: 1 Bottle of Wine Weekly  . Drug use: No  . Sexual activity: Yes  Other Topics Concern  . Not on file  Social History Narrative  .  Not on file    Allergies  Allergen Reactions  . Hydrocodone Nausea And Vomiting  . Penicillins Hives and Itching  . Sulfa Antibiotics Nausea And Vomiting    Outpatient Medications Prior to Visit  Medication Sig Dispense Refill  . azithromycin (ZITHROMAX) 250 MG tablet 2 today then 1 a day for 4 days 6 tablet 1  . benzonatate (TESSALON) 100 MG capsule Take 1 capsule (100 mg total) by mouth 2 (two) times daily as needed for cough. 20 capsule 0   No facility-administered medications prior to visit.     Review of Systems  Constitutional: Negative for chills, diaphoresis, fever, malaise/fatigue and weight loss.  HENT: Positive for congestion, ear pain, sinus pressure and sore throat. Negative for ear discharge, hoarse voice and sneezing.   Eyes: Negative for blurred vision.  Respiratory: Positive for cough.  Negative for sputum production, shortness of breath and wheezing.   Cardiovascular: Negative for chest pain, palpitations and leg swelling.  Gastrointestinal: Negative for abdominal pain, blood in stool, constipation, diarrhea, heartburn, melena and nausea.  Genitourinary: Negative for dysuria, frequency, hematuria and urgency.  Musculoskeletal: Negative for back pain, joint pain, myalgias and neck pain.  Skin: Negative for rash.  Neurological: Negative for dizziness, tingling, sensory change, focal weakness and headaches.  Endo/Heme/Allergies: Negative for environmental allergies and polydipsia. Does not bruise/bleed easily.  Psychiatric/Behavioral: Negative for depression and suicidal ideas. The patient is not nervous/anxious and does not have insomnia.      Objective  Vitals:   08/23/17 1626  BP: 120/80  Pulse: 60  Weight: 127 lb (57.6 kg)  Height: 5\' 1"  (1.549 m)    Physical Exam  Constitutional: She is well-developed, well-nourished, and in no distress. No distress.  HENT:  Head: Normocephalic and atraumatic.  Right Ear: External ear normal.  Left Ear: External ear normal.  Nose: Nose normal.  Mouth/Throat: Oropharynx is clear and moist.  Eyes: Conjunctivae and EOM are normal. Pupils are equal, round, and reactive to light. Right eye exhibits no discharge. Left eye exhibits no discharge.  Neck: Normal range of motion. Neck supple. No JVD present. No thyromegaly present.  Cardiovascular: Normal rate, regular rhythm, normal heart sounds and intact distal pulses. Exam reveals no gallop and no friction rub.  No murmur heard. Pulmonary/Chest: Effort normal and breath sounds normal.  Abdominal: Soft. Bowel sounds are normal. She exhibits no mass. There is no tenderness. There is no guarding.  Musculoskeletal: Normal range of motion. She exhibits no edema.  Lymphadenopathy:    She has no cervical adenopathy.  Neurological: She is alert. She has normal reflexes.  Skin: Skin is  warm and dry. No rash noted. She is not diaphoretic.  Psychiatric: Mood and affect normal.  Nursing note and vitals reviewed.     Assessment & Plan  Problem List Items Addressed This Visit    None    Visit Diagnoses    Bronchitis    -  Primary   Relevant Medications   azithromycin (ZITHROMAX) 250 MG tablet   Acute non-recurrent maxillary sinusitis       Relevant Medications   azithromycin (ZITHROMAX) 250 MG tablet      Meds ordered this encounter  Medications  . azithromycin (ZITHROMAX) 250 MG tablet    Sig: 2 today then 1 a day for 4 days    Dispense:  6 tablet    Refill:  1      Dr. Macon [facility]  08/23/17

## 2018-01-04 ENCOUNTER — Other Ambulatory Visit: Payer: Self-pay | Admitting: Obstetrics and Gynecology

## 2018-01-04 DIAGNOSIS — Z13820 Encounter for screening for osteoporosis: Secondary | ICD-10-CM

## 2018-01-04 DIAGNOSIS — Z1231 Encounter for screening mammogram for malignant neoplasm of breast: Secondary | ICD-10-CM

## 2018-03-23 DIAGNOSIS — C4491 Basal cell carcinoma of skin, unspecified: Secondary | ICD-10-CM

## 2018-03-23 HISTORY — DX: Basal cell carcinoma of skin, unspecified: C44.91

## 2018-04-20 ENCOUNTER — Ambulatory Visit
Admission: RE | Admit: 2018-04-20 | Discharge: 2018-04-20 | Disposition: A | Discharge status: Home / Self Care | Payer: BC Managed Care – PPO | Source: Ambulatory Visit | Attending: Obstetrics and Gynecology | Admitting: Obstetrics and Gynecology

## 2018-04-20 DIAGNOSIS — Z1231 Encounter for screening mammogram for malignant neoplasm of breast: Secondary | ICD-10-CM | POA: Diagnosis present

## 2018-04-20 DIAGNOSIS — Z13820 Encounter for screening for osteoporosis: Secondary | ICD-10-CM | POA: Insufficient documentation

## 2019-03-07 ENCOUNTER — Encounter: Payer: Self-pay | Admitting: *Deleted

## 2019-03-07 ENCOUNTER — Telehealth: Payer: Self-pay | Admitting: Gastroenterology

## 2019-03-07 NOTE — Telephone Encounter (Signed)
Gastroenterology Pre-Procedure Review  Request Date: Friday 03/24/2019    [facility] Requesting Physician: Dr. Allen Norris  PATIENT REVIEW QUESTIONS: The patient responded to the following health history questions as indicated:    1. Are you having any GI issues? no 2. Do you have a personal history of Polyps? yes (10/2015) 3. Do you have a family history of Colon Cancer or Polyps? yes (Sister w/ polyps and colon surgery) 4. Diabetes Mellitus? no 5. Joint replacements in the past 12 months?no. 6. Major health problems in the past 3 months?no 7. Any artificial heart valves, MVP, or defibrillator?no    MEDICATIONS & ALLERGIES:    Patient reports the following regarding taking any anticoagulation/antiplatelet therapy:   Plavix, Coumadin, Eliquis, Xarelto, Lovenox, Pradaxa, Brilinta, or Effient? no Aspirin? no  Patient confirms/reports the following medications:  Current Outpatient Medications  Medication Sig Dispense Refill  . azithromycin (ZITHROMAX) 250 MG tablet 2 today then 1 a day for 4 days 6 tablet 1   No current facility-administered medications for this visit.     Patient confirms/reports the following allergies:  Allergies  Allergen Reactions  . Hydrocodone Nausea And Vomiting  . Penicillins Hives and Itching  . Sulfa Antibiotics Nausea And Vomiting    No orders of the defined types were placed in this encounter.   AUTHORIZATION INFORMATION Primary Insurance: 1D#: Group #:  Secondary Insurance: 1D#: Group #:  SCHEDULE INFORMATION: Date:  Time: Location:

## 2019-03-08 ENCOUNTER — Other Ambulatory Visit: Payer: Self-pay

## 2019-03-08 DIAGNOSIS — Z8371 Family history of colonic polyps: Secondary | ICD-10-CM

## 2019-03-08 DIAGNOSIS — Z86010 Personal history of colonic polyps: Secondary | ICD-10-CM

## 2019-03-23 ENCOUNTER — Other Ambulatory Visit: Payer: Self-pay

## 2019-03-28 ENCOUNTER — Other Ambulatory Visit
Admission: RE | Admit: 2019-03-28 | Discharge: 2019-03-28 | Disposition: A | Discharge status: Home / Self Care | Payer: BC Managed Care – PPO | Source: Ambulatory Visit | Attending: Gastroenterology | Admitting: Gastroenterology

## 2019-03-28 ENCOUNTER — Other Ambulatory Visit: Payer: Self-pay

## 2019-03-28 DIAGNOSIS — Z01812 Encounter for preprocedural laboratory examination: Secondary | ICD-10-CM | POA: Insufficient documentation

## 2019-03-28 DIAGNOSIS — Z20828 Contact with and (suspected) exposure to other viral communicable diseases: Secondary | ICD-10-CM | POA: Insufficient documentation

## 2019-03-29 LAB — SARS CORONAVIRUS 2 (TAT 6-24 HRS): SARS Coronavirus 2: NEGATIVE

## 2019-03-30 NOTE — Anesthesia Preprocedure Evaluation (Addendum)
Anesthesia Evaluation  Patient identified by MRN, date of birth, ID band Patient awake    Reviewed: Allergy & Precautions, NPO status , Patient's Chart, lab work & pertinent test results  History of Anesthesia Complications Negative for: history of anesthetic complications  Airway Mallampati: IV   Neck ROM: Full    Dental no notable dental hx.    Pulmonary former smoker (quit 2016),    Pulmonary exam normal breath sounds clear to auscultation       Cardiovascular Normal cardiovascular exam+ Valvular Problems/Murmurs MVP  Rhythm:Regular Rate:Normal     Neuro/Psych negative neurological ROS     GI/Hepatic negative GI ROS,   Endo/Other  Hypothyroidism   Renal/GU negative Renal ROS     Musculoskeletal  (+) Arthritis , Scoliosis    Abdominal   Peds  Hematology negative hematology ROS (+)   Anesthesia Other Findings   Reproductive/Obstetrics                            Anesthesia Physical Anesthesia Plan  ASA: II  Anesthesia Plan: General   Post-op Pain Management:    Induction: Intravenous  PONV Risk Score and Plan: 3 and Propofol infusion and TIVA  Airway Management Planned: Natural Airway  Additional Equipment:   Intra-op Plan:   Post-operative Plan:   Informed Consent: I have reviewed the patients History and Physical, chart, labs and discussed the procedure including the risks, benefits and alternatives for the proposed anesthesia with the patient or authorized representative who has indicated his/her understanding and acceptance.       Plan Discussed with: CRNA  Anesthesia Plan Comments:        Anesthesia Quick Evaluation

## 2019-03-30 NOTE — Discharge Instructions (Signed)

## 2019-03-31 ENCOUNTER — Ambulatory Visit: Payer: BC Managed Care – PPO | Admitting: Anesthesiology

## 2019-03-31 ENCOUNTER — Ambulatory Visit
Admission: RE | Admit: 2019-03-31 | Discharge: 2019-03-31 | Disposition: A | Discharge status: Home / Self Care | Payer: BC Managed Care – PPO | Attending: Gastroenterology | Admitting: Gastroenterology

## 2019-03-31 ENCOUNTER — Other Ambulatory Visit: Payer: Self-pay

## 2019-03-31 ENCOUNTER — Encounter
Admission: RE | Disposition: A | Discharge status: Home / Self Care | Payer: Self-pay | Source: Home / Self Care | Attending: Gastroenterology

## 2019-03-31 DIAGNOSIS — K641 Second degree hemorrhoids: Secondary | ICD-10-CM | POA: Diagnosis not present

## 2019-03-31 DIAGNOSIS — Z860100 Personal history of colon polyps, unspecified: Secondary | ICD-10-CM

## 2019-03-31 DIAGNOSIS — I341 Nonrheumatic mitral (valve) prolapse: Secondary | ICD-10-CM | POA: Diagnosis not present

## 2019-03-31 DIAGNOSIS — Z87891 Personal history of nicotine dependence: Secondary | ICD-10-CM | POA: Insufficient documentation

## 2019-03-31 DIAGNOSIS — D125 Benign neoplasm of sigmoid colon: Secondary | ICD-10-CM

## 2019-03-31 DIAGNOSIS — M419 Scoliosis, unspecified: Secondary | ICD-10-CM | POA: Insufficient documentation

## 2019-03-31 DIAGNOSIS — Z79899 Other long term (current) drug therapy: Secondary | ICD-10-CM | POA: Diagnosis not present

## 2019-03-31 DIAGNOSIS — K635 Polyp of colon: Secondary | ICD-10-CM

## 2019-03-31 DIAGNOSIS — E039 Hypothyroidism, unspecified: Secondary | ICD-10-CM | POA: Insufficient documentation

## 2019-03-31 DIAGNOSIS — Z882 Allergy status to sulfonamides status: Secondary | ICD-10-CM | POA: Insufficient documentation

## 2019-03-31 DIAGNOSIS — Z881 Allergy status to other antibiotic agents status: Secondary | ICD-10-CM | POA: Insufficient documentation

## 2019-03-31 DIAGNOSIS — H409 Unspecified glaucoma: Secondary | ICD-10-CM | POA: Insufficient documentation

## 2019-03-31 DIAGNOSIS — K219 Gastro-esophageal reflux disease without esophagitis: Secondary | ICD-10-CM | POA: Diagnosis not present

## 2019-03-31 DIAGNOSIS — Z8719 Personal history of other diseases of the digestive system: Secondary | ICD-10-CM | POA: Diagnosis present

## 2019-03-31 DIAGNOSIS — M1990 Unspecified osteoarthritis, unspecified site: Secondary | ICD-10-CM | POA: Insufficient documentation

## 2019-03-31 DIAGNOSIS — Z880 Allergy status to penicillin: Secondary | ICD-10-CM | POA: Insufficient documentation

## 2019-03-31 DIAGNOSIS — K648 Other hemorrhoids: Secondary | ICD-10-CM | POA: Insufficient documentation

## 2019-03-31 DIAGNOSIS — Z885 Allergy status to narcotic agent status: Secondary | ICD-10-CM | POA: Insufficient documentation

## 2019-03-31 HISTORY — DX: Other complications of anesthesia, initial encounter: T88.59XA

## 2019-03-31 HISTORY — PX: COLONOSCOPY WITH PROPOFOL: SHX5780

## 2019-03-31 HISTORY — DX: Unspecified glaucoma: H40.9

## 2019-03-31 HISTORY — PX: POLYPECTOMY: SHX5525

## 2019-03-31 SURGERY — COLONOSCOPY WITH PROPOFOL
Anesthesia: General | Site: Rectum

## 2019-03-31 MED ORDER — ACETAMINOPHEN 325 MG PO TABS: 650.0000 mg | ORAL_TABLET | Freq: Once | ORAL | Status: DC | PRN

## 2019-03-31 MED ORDER — LACTATED RINGERS IV SOLN: 10.0000 mL/h | INTRAVENOUS | Status: DC

## 2019-03-31 MED ORDER — STERILE WATER FOR IRRIGATION IR SOLN
Status: DC | PRN
  Administered 2019-03-31: 30 mL

## 2019-03-31 MED ORDER — ACETAMINOPHEN 160 MG/5ML PO SOLN: 325.0000 mg | ORAL | Status: DC | PRN

## 2019-03-31 MED ORDER — LACTATED RINGERS IV SOLN
INTRAVENOUS | Status: DC | PRN
  Administered 2019-03-31: 09:00:00 via INTRAVENOUS

## 2019-03-31 MED ORDER — PROPOFOL 10 MG/ML IV BOLUS
INTRAVENOUS | Status: DC | PRN
  Administered 2019-03-31 (×3): 30 mg via INTRAVENOUS
  Administered 2019-03-31: 120 mg via INTRAVENOUS
  Administered 2019-03-31 (×2): 30 mg via INTRAVENOUS
  Administered 2019-03-31: 20 mg via INTRAVENOUS
  Administered 2019-03-31 (×2): 30 mg via INTRAVENOUS
  Administered 2019-03-31: 20 mg via INTRAVENOUS

## 2019-03-31 MED ORDER — ONDANSETRON HCL 4 MG/2ML IJ SOLN: 4.0000 mg | Freq: Once | INTRAMUSCULAR | Status: DC | PRN

## 2019-03-31 MED ORDER — LIDOCAINE HCL (CARDIAC) PF 100 MG/5ML IV SOSY
PREFILLED_SYRINGE | INTRAVENOUS | Status: DC | PRN
  Administered 2019-03-31: 30 mg via INTRAVENOUS

## 2019-03-31 SURGICAL SUPPLY — 12 items
CANISTER SUCT 1200ML W/VALVE (MISCELLANEOUS) ×3 IMPLANT
CLIP HMST 235XBRD CATH ROT (MISCELLANEOUS) ×1 IMPLANT
CLIP RESOLUTION 360 11X235 (MISCELLANEOUS) ×2
ELECT REM PT RETURN 9FT ADLT (ELECTROSURGICAL) ×3
ELECTRODE REM PT RTRN 9FT ADLT (ELECTROSURGICAL) ×1 IMPLANT
FORCEPS BIOP RAD 4 LRG CAP 4 (CUTTING FORCEPS) ×3 IMPLANT
GOWN CVR UNV OPN BCK APRN NK (MISCELLANEOUS) ×2 IMPLANT
GOWN ISOL THUMB LOOP REG UNIV (MISCELLANEOUS) ×4
KIT ENDO PROCEDURE OLY (KITS) ×3 IMPLANT
SNARE SHORT THROW 13M SML OVAL (MISCELLANEOUS) ×3 IMPLANT
TRAP ETRAP POLY (MISCELLANEOUS) ×3 IMPLANT
WATER STERILE IRR 250ML POUR (IV SOLUTION) ×3 IMPLANT

## 2019-03-31 NOTE — Anesthesia Procedure Notes (Signed)
Procedure Name: MAC Performed by: Kyelle Urbas, CRNA Pre-anesthesia Checklist: Patient identified, Emergency Drugs available, Suction available, Timeout performed and Patient being monitored Patient Re-evaluated:Patient Re-evaluated prior to induction Oxygen Delivery Method: Nasal cannula Placement Confirmation: positive ETCO2       

## 2019-03-31 NOTE — Op Note (Signed)
[facility] Gastroenterology Patient Name: Brooke Lindsey Procedure Date: 03/31/2019 8:27 AM MRN: [id number] Account #: [id number] Date of Birth: May 26, 1961 Admit Type: Outpatient Age: 58 Room: [facility] OR ROOM 01 Gender: Female Note Status: Finalized Procedure:            Colonoscopy Indications:          High risk colon cancer surveillance: Personal history                        of colonic polyps Providers:            Lucilla Lame MD, MD Referring MD:         Shelby Mattocks. Georga Bora, MD (Referring MD) Medicines:            Propofol per Anesthesia Complications:        No immediate complications. Procedure:            Pre-Anesthesia Assessment:                       - Prior to the procedure, a History and Physical was                        performed, and patient medications and allergies were                        reviewed. The patient's tolerance of previous                        anesthesia was also reviewed. The risks and benefits of                        the procedure and the sedation options and risks were                        discussed with the patient. All questions were                        answered, and informed consent was obtained. Prior                        Anticoagulants: The patient has taken no previous                        anticoagulant or antiplatelet agents. ASA Grade                        Assessment: II - A patient with mild systemic disease.                        After reviewing the risks and benefits, the patient was                        deemed in satisfactory condition to undergo the                        procedure.                       After obtaining informed consent, the colonoscope was  passed under direct vision. Throughout the procedure,                        the patient's blood pressure, pulse, and oxygen                        saturations were monitored continuously. The   Colonoscope was introduced through the anus and                        advanced to the the cecum, identified by appendiceal                        orifice and ileocecal valve. The colonoscopy was                        performed without difficulty. The patient tolerated the                        procedure well. The quality of the bowel preparation                        was excellent. Findings:      The perianal and digital rectal examinations were normal.      A 4 mm polyp was found in the ascending colon. The polyp was sessile.       The polyp was removed with a cold snare. Resection and retrieval were       complete.      A 3 mm polyp was found in the sigmoid colon. The polyp was sessile. The       polyp was removed with a cold biopsy forceps. Resection and retrieval       were complete.      A 7 mm polyp was found in the sigmoid colon. The polyp was pedunculated.       The polyp was removed with a hot snare. Resection and retrieval were       complete. To prevent bleeding post-intervention, one hemostatic clip was       successfully placed (MR conditional). There was no bleeding at the end       of the procedure.      Non-bleeding internal hemorrhoids were found during retroflexion. The       hemorrhoids were Grade II (internal hemorrhoids that prolapse but reduce       spontaneously). Impression:           - One 4 mm polyp in the ascending colon, removed with a                        cold snare. Resected and retrieved.                       - One 3 mm polyp in the sigmoid colon, removed with a                        cold biopsy forceps. Resected and retrieved.                       - One 7 mm polyp in the sigmoid colon, removed with a  hot snare. Resected and retrieved. Clip (MR                        conditional) was placed.                       - Non-bleeding internal hemorrhoids. Recommendation:       - Discharge patient to home.                       -  Resume previous diet.                       - Continue present medications.                       - Await pathology results.                       - Repeat colonoscopy in 5 years for surveillance. Procedure Code(s):    --- Professional ---                       [phone], Colonoscopy, flexible; with removal of tumor(s),                        polyp(s), or other lesion(s) by snare technique                       45380, 2, Colonoscopy, flexible; with biopsy, single                        or multiple Diagnosis Code(s):    --- Professional ---                       Z86.010, Personal history of colonic polyps                       K63.5, Polyp of colon CPT copyright 2019 American Medical Association. All rights reserved. The codes documented in this report are preliminary and upon coder review may  be revised to meet current compliance requirements. Lucilla Lame MD, MD 03/31/2019 9:06:59 AM This report has been signed electronically. Number of Addenda: 0 Note Initiated On: 03/31/2019 8:27 AM Scope Withdrawal Time: 0 hours 21 minutes 10 seconds  Total Procedure Duration: 0 hours 25 minutes 23 seconds  Estimated Blood Loss: Estimated blood loss: none.      [facility]

## 2019-03-31 NOTE — H&P (Signed)
Lucilla Lame, MD [address] [place], [place] [address] Phone:[phone] Fax : [phone]  Primary Care Physician:  Elgie Collard, MD Primary Gastroenterologist:  Dr. Allen Norris  Pre-Procedure History & Physical: HPI:  Brooke Lindsey is a 58 y.o. female is here for an colonoscopy.   Past Medical History:  Diagnosis Date  . Anemia    when younger  . Arthritis    left foot and leg  . Benign neoplasm of ascending colon   . Benign neoplasm of sigmoid colon   . Complication of anesthesia    difficult waking up  . Compression fracture of C-spine (HCC)    C4-C5 - 30 yrs ago - no limitations or issues  . GERD (gastroesophageal reflux disease)    not recently  . Glaucoma   . Hypothyroidism    in past - no meds or issues over 5 yrs  . Mitral valve prolapse    followed by PCP  . Mitral valve prolapse    no issues  . Scoliosis    mild - lower back  . Special screening for malignant neoplasms, colon   . Wears contact lenses     Past Surgical History:  Procedure Laterality Date  . CESAREAN SECTION     x 2  . COLONOSCOPY    . COLONOSCOPY WITH PROPOFOL N/A 10/21/2015   Procedure: COLONOSCOPY WITH PROPOFOL;  Surgeon: Lucilla Lame, MD;  Location: [facility];  Service: Endoscopy;  Laterality: N/A;  . COLPOSCOPY    . FOOT SURGERY Left   . MANDIBLE SURGERY    . POLYPECTOMY  10/21/2015   Procedure: POLYPECTOMY;  Surgeon: Lucilla Lame, MD;  Location: [facility];  Service: Endoscopy;;    Prior to Admission medications   Medication Sig Start Date End Date Taking? Authorizing Provider  Biotin-D POWD by Does not apply route as needed.   Yes [provider]  latanoprost (XALATAN) 0.005 % ophthalmic solution Place 1 drop into both eyes at bedtime.   Yes [provider]  Multiple Vitamin (MULTIVITAMIN) capsule Take 1 capsule by mouth as needed.   Yes [provider]  VITAMIN E BLEND PO Take by mouth as needed.   Yes [provider]  azithromycin (ZITHROMAX) 250 MG tablet 2 today then 1 a day for 4 days Patient not taking: Reported on 03/23/2019 08/23/17   Juline Patch, MD    Allergies as of 03/08/2019 - Review Complete 08/23/2017  Allergen Reaction Noted  . Hydrocodone Nausea And Vomiting 09/03/2015  . Penicillins Hives and Itching 09/03/2015  . Sulfa antibiotics Nausea And Vomiting 09/03/2015    Family History  Problem Relation Age of Onset  . COPD Mother   . Stroke Mother   . Cancer Maternal Grandmother   . Heart disease Maternal Grandfather   . Hypertension Maternal Grandfather   . Diabetes Paternal Grandmother   . Heart disease Paternal Grandmother   . Stroke Paternal Grandmother   . Heart disease Paternal Grandfather   . Hypertension Paternal Grandfather   . Breast cancer Neg Hx     Social History   Socioeconomic History  . Marital status: Married    Spouse name: Not on file  . Number of children: Not on file  . Years of education: Not on file  . Highest education level: Not on file  Occupational History  . Not on file  Social Needs  . Financial resource strain: Not on file  . Food insecurity    Worry: Not  on file    Inability: Not on file  . Transportation needs    Medical: Not on file    Non-medical: Not on file  Tobacco Use  . Smoking status: Former Smoker    Packs/day: 0.25    Years: 40.00    Pack years: 10.00    Types: Cigarettes    Quit date: 04/14/2015    Years since quitting: 3.9  . Smokeless tobacco: Never Used  . Tobacco comment: 1/2 PPWeek, off and on for 35 yrs  Substance and Sexual Activity  . Alcohol use: Yes    Comment: 1 Bottle of Wine Weekly  . Drug use: No  . Sexual activity: Yes  Lifestyle  . Physical activity    Days per week: Not on file    Minutes per session: Not on file  . Stress: Not on file  Relationships  . Social Herbalist on phone: Not on file    Gets together: Not on file    Attends religious service: Not on file     Active member of club or organization: Not on file    Attends meetings of clubs or organizations: Not on file    Relationship status: Not on file  . Intimate partner violence    Fear of current or ex partner: Not on file    Emotionally abused: Not on file    Physically abused: Not on file    Forced sexual activity: Not on file  Other Topics Concern  . Not on file  Social History Narrative  . Not on file    Review of Systems: See HPI, otherwise negative ROS  Physical Exam: BP 117/81   Pulse (!) 59   Temp 97.7 F (36.5 C)   Ht 5\' 1"  (1.549 m)   Wt 59.4 kg   SpO2 98%   BMI 24.75 kg/m  General:   Alert,  pleasant and cooperative in NAD Head:  Normocephalic and atraumatic. Neck:  Supple; no masses or thyromegaly. Lungs:  Clear throughout to auscultation.    Heart:  Regular rate and rhythm. Abdomen:  Soft, nontender and nondistended. Normal bowel sounds, without guarding, and without rebound.   Neurologic:  Alert and  oriented x4;  grossly normal neurologically.  Impression/Plan: Brooke Lindsey is here for an colonoscopy to be performed for history of colon polyps 10/21/2015  Risks, benefits, limitations, and alternatives regarding  colonoscopy have been reviewed with the patient.  Questions have been answered.  All parties agreeable.   Lucilla Lame, MD  03/31/2019, 7:54 AM

## 2019-03-31 NOTE — Anesthesia Postprocedure Evaluation (Signed)
Anesthesia Post Note  Patient: Brooke Lindsey  Procedure(s) Performed: COLONOSCOPY WITH BIOPSY (N/A Rectum) POLYPECTOMY (N/A Rectum)  Patient location during evaluation: PACU Anesthesia Type: General Level of consciousness: awake and alert, oriented and patient cooperative Pain management: pain level controlled Vital Signs Assessment: post-procedure vital signs reviewed and stable Respiratory status: spontaneous breathing, nonlabored ventilation and respiratory function stable Cardiovascular status: blood pressure returned to baseline and stable Postop Assessment: adequate PO intake Anesthetic complications: no    Darrin Nipper

## 2019-03-31 NOTE — Transfer of Care (Signed)
Immediate Anesthesia Transfer of Care Note  Patient: Brooke Lindsey  Procedure(s) Performed: COLONOSCOPY WITH BIOPSY (N/A Rectum) POLYPECTOMY (N/A Rectum)  Patient Location: PACU  Anesthesia Type: General  Level of Consciousness: awake, alert  and patient cooperative  Airway and Oxygen Therapy: Patient Spontanous Breathing and Patient connected to supplemental oxygen  Post-op Assessment: Post-op Vital signs reviewed, Patient's Cardiovascular Status Stable, Respiratory Function Stable, Patent Airway and No signs of Nausea or vomiting  Post-op Vital Signs: Reviewed and stable  Complications: No apparent anesthesia complications

## 2019-04-03 ENCOUNTER — Encounter: Payer: Self-pay | Admitting: Gastroenterology

## 2019-04-05 ENCOUNTER — Encounter: Payer: Self-pay | Admitting: Gastroenterology

## 2019-07-19 ENCOUNTER — Other Ambulatory Visit: Payer: Self-pay | Admitting: Obstetrics and Gynecology

## 2019-07-19 DIAGNOSIS — Z1231 Encounter for screening mammogram for malignant neoplasm of breast: Secondary | ICD-10-CM

## 2019-07-20 ENCOUNTER — Ambulatory Visit
Admission: RE | Admit: 2019-07-20 | Discharge: 2019-07-20 | Disposition: A | Discharge status: Home / Self Care | Payer: BC Managed Care – PPO | Source: Ambulatory Visit | Attending: Obstetrics and Gynecology | Admitting: Obstetrics and Gynecology

## 2019-07-20 DIAGNOSIS — Z1231 Encounter for screening mammogram for malignant neoplasm of breast: Secondary | ICD-10-CM | POA: Diagnosis not present

## 2020-03-13 ENCOUNTER — Other Ambulatory Visit: Payer: Self-pay

## 2020-03-13 ENCOUNTER — Ambulatory Visit: Payer: BC Managed Care – PPO | Admitting: Dermatology

## 2020-03-13 ENCOUNTER — Encounter: Payer: Self-pay | Admitting: Dermatology

## 2020-03-13 DIAGNOSIS — Z1283 Encounter for screening for malignant neoplasm of skin: Secondary | ICD-10-CM | POA: Diagnosis not present

## 2020-03-13 DIAGNOSIS — L814 Other melanin hyperpigmentation: Secondary | ICD-10-CM | POA: Diagnosis not present

## 2020-03-13 DIAGNOSIS — Z85828 Personal history of other malignant neoplasm of skin: Secondary | ICD-10-CM

## 2020-03-13 DIAGNOSIS — D489 Neoplasm of uncertain behavior, unspecified: Secondary | ICD-10-CM

## 2020-03-13 DIAGNOSIS — D2372 Other benign neoplasm of skin of left lower limb, including hip: Secondary | ICD-10-CM | POA: Diagnosis not present

## 2020-03-13 DIAGNOSIS — D239 Other benign neoplasm of skin, unspecified: Secondary | ICD-10-CM

## 2020-03-13 DIAGNOSIS — D229 Melanocytic nevi, unspecified: Secondary | ICD-10-CM

## 2020-03-13 DIAGNOSIS — D485 Neoplasm of uncertain behavior of skin: Secondary | ICD-10-CM | POA: Diagnosis not present

## 2020-03-13 DIAGNOSIS — L821 Other seborrheic keratosis: Secondary | ICD-10-CM

## 2020-03-13 DIAGNOSIS — L111 Transient acantholytic dermatosis [Grover]: Secondary | ICD-10-CM | POA: Diagnosis not present

## 2020-03-13 DIAGNOSIS — L578 Other skin changes due to chronic exposure to nonionizing radiation: Secondary | ICD-10-CM

## 2020-03-13 DIAGNOSIS — D1800 Hemangioma unspecified site: Secondary | ICD-10-CM

## 2020-03-13 MED ORDER — CALCIPOTRIENE 0.005 % EX CREA: TOPICAL_CREAM | Freq: Two times a day (BID) | CUTANEOUS | 0 refills | Status: DC

## 2020-03-13 MED ORDER — MUPIROCIN 2 % EX OINT: 1.0000 "application " | TOPICAL_OINTMENT | Freq: Two times a day (BID) | CUTANEOUS | 1 refills | Status: DC

## 2020-03-13 MED ORDER — HALOBETASOL PROPIONATE 0.05 % EX FOAM: CUTANEOUS | 3 refills | Status: DC

## 2020-03-13 NOTE — Progress Notes (Signed)
Follow-Up Visit   Subjective  Brooke Lindsey is a 59 y.o. female who presents for the following: TBSE.  Patient presents today for annual TBSE, has a few concerns on her Left knee and a rash on chest, patient has been using halobetasol foam for this. Patient does have h/o BCC left medial inferior shin.  The following portions of the chart were reviewed this encounter and updated as appropriate:  Tobacco  Allergies  Meds  Problems  Med Hx  Surg Hx  Fam Hx      Review of Systems:  No other skin or systemic complaints except as noted in HPI or Assessment and Plan.  Objective  Well appearing patient in no apparent distress; mood and affect are within normal limits.  A full examination was performed including scalp, head, eyes, ears, nose, lips, neck, chest, axillae, abdomen, back, buttocks, bilateral upper extremities, bilateral lower extremities, hands, feet, fingers, toes, fingernails, and toenails. All findings within normal limits unless otherwise noted below.  Objective  Left Lateral  Knee: Firm pink/brown papulenodule with dimple sign.   Objective  Chest - Medial [facility]): Red papules and papulovesicles.   Objective  Left distal shin: 0.5 cm pink papule       Objective  Right Lower Leg - Posterior: 0.4 cm red papule Right distal calf        Assessment & Plan  Dermatofibroma Left Lateral  Knee  Benign-appearing.  Observation.  Call clinic for new or changing lesions.  Recommend daily use of broad spectrum spf 30+ sunscreen to sun-exposed areas.    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  Start Calcipotriene cream use on affected area twice daily as needed Refill Halobetasol foam use on affected area up to twice daily as needed up to 2 weeks  Recommend Gold Bond Rapid Relief Anti-Itch cream up to 3 times per day to areas that are itchy.   calcipotriene (DOVONOX) 0.005 % cream - Chest - Medial (Center)  Halobetasol Propionate  0.05 % FOAM - Chest - Medial (Center)  Neoplasm of uncertain behavior (2) Left distal shin  Skin / nail biopsy Type of biopsy: incisional   Informed consent: discussed and consent obtained   Timeout: patient name, date of birth, surgical site, and procedure verified   Procedure prep:  Patient was prepped and draped in usual sterile fashion Prep type:  Isopropyl alcohol Anesthesia: the lesion was anesthetized in a standard fashion   Anesthetic:  1% lidocaine w/ epinephrine 1-100,000 buffered w/ 8.4% NaHCO3 Instrument used: flexible razor blade   Hemostasis achieved with: aluminum chloride   Outcome: patient tolerated procedure well   Post-procedure details: sterile dressing applied and wound care instructions given   Dressing type: bacitracin    mupirocin ointment (BACTROBAN) 2 %  Specimen 1 - Surgical pathology Differential Diagnosis: R/O Pigmented BCC Check Margins: No 0.5 cm pink papule  Right Lower Leg - Posterior  Skin / nail biopsy Type of biopsy: incisional   Informed consent: discussed and consent obtained   Timeout: patient name, date of birth, surgical site, and procedure verified   Procedure prep:  Patient was prepped and draped in usual sterile fashion Prep type:  Isopropyl alcohol Anesthesia: the lesion was anesthetized in a standard fashion   Anesthetic:  1% lidocaine w/ epinephrine 1-100,000 buffered w/ 8.4% NaHCO3 Instrument used: flexible razor blade   Hemostasis achieved with: aluminum chloride   Outcome: patient tolerated procedure well   Post-procedure details: sterile dressing applied and wound care  instructions given   Dressing type: bacitracin    Specimen 2 - Surgical pathology Differential Diagnosis: R/O BCC vs lichenoid keratosis over amelanotic melanoma over other Check Margins: No 0.4 cm red papule  Shave biopsy    Lentigines - Scattered tan macules - Discussed due to sun exposure - Benign, observe - Call for any changes  Seborrheic  Keratoses - Stuck-on, waxy, tan-brown papules and plaques  - Discussed benign etiology and prognosis. - Observe - Call for any changes  Melanocytic Nevi - Tan-brown and/or pink-flesh-colored symmetric macules and papules - Benign appearing on exam today - Observation - Call clinic for new or changing moles - Recommend daily use of broad spectrum spf 30+ sunscreen to sun-exposed areas.   Hemangiomas - Red papules - Discussed benign nature - Observe - Call for any changes  Actinic Damage - diffuse scaly erythematous macules with underlying dyspigmentation - Recommend daily broad spectrum sunscreen SPF 30+ to sun-exposed areas, reapply every 2 hours as needed.  - Call for new or changing lesions.  Skin cancer screening performed today.  History of Basal Cell Carcinoma of the Skin - No evidence of recurrence today - Recommend regular full body skin exams - Recommend daily broad spectrum sunscreen SPF 30+ to sun-exposed areas, reapply every 2 hours as needed.  - Call if any new or changing lesions are noted between office visits   Return in about 6 months (around 09/13/2020) for TBSE.  I, Donzetta Kohut, CMA, am acting as scribe for Forest Gleason, MD .  Documentation: I have reviewed the above documentation for accuracy and completeness, and I agree with the above.  Forest Gleason, MD

## 2020-03-13 NOTE — Patient Instructions (Addendum)
Recommend daily broad spectrum sunscreen SPF 30+ to sun-exposed areas, reapply every 2 hours as needed. Call for new or changing lesions.  Recommend Gold Bond Rapid Relief Anti-Itch cream up to 3 times per day to areas that are itchy.  Topical steroids (such as triamcinolone, fluocinolone, fluocinonide, mometasone, clobetasol, halobetasol, betamethasone, hydrocortisone) can cause thinning and lightening of the skin if they are used for too long in the same area. Your physician has selected the right strength medicine for your problem and area affected on the body. Please use your medication only as directed by your physician to prevent side effects.    Wound Care Instructions  1. Cleanse wound gently with soap and water once a day then pat dry with clean gauze. Apply a thing coat of Petrolatum (petroleum jelly, "Vaseline") over the wound (unless you have an allergy to this). We recommend that you use a new, sterile tube of Vaseline. Do not pick or remove scabs. Do not remove the yellow or white "healing tissue" from the base of the wound.  2. Cover the wound with fresh, clean, nonstick gauze and secure with paper tape. You may use Band-Aids in place of gauze and tape if the would is small enough, but would recommend trimming much of the tape off as there is often too much. Sometimes Band-Aids can irritate the skin.  3. You should call the office for your biopsy report after 1 week if you have not already been contacted.  4. If you experience any problems, such as abnormal amounts of bleeding, swelling, significant bruising, significant pain, or evidence of infection, please call the office immediately.  5. FOR ADULT SURGERY PATIENTS: If you need something for pain relief you may take 1 extra strength Tylenol (acetaminophen) AND 2 Ibuprofen (200mg  each) together every 4 hours as needed for pain. (do not take these if you are allergic to them or if you have a reason you should not take them.)  Typically, you may only need pain medication for 1 to 3 days.

## 2020-03-20 DIAGNOSIS — L570 Actinic keratosis: Secondary | ICD-10-CM

## 2020-03-20 HISTORY — DX: Actinic keratosis: L57.0

## 2020-03-20 NOTE — Progress Notes (Signed)
1. Skin , left distal shin BASAL CELL CARCINOMA, NODULAR PATTERN --> discussed ED&C vs Mohs surgery. Patient prefers ED&C.   2. Skin , right distal calf ACTINIC KERATOSIS, PIGMENTED --> LN2 in clinic  Admin staff, please call to schedule appointment in September. Thank you!

## 2020-03-26 ENCOUNTER — Encounter: Payer: Self-pay | Admitting: Dermatology

## 2020-04-22 ENCOUNTER — Telehealth: Payer: Self-pay

## 2020-04-22 NOTE — Telephone Encounter (Signed)
Pt states that she has been using the Calcipotriene as directed and she does not feel that it is helping. She states the issue is getting worse. She is wondering if there is a different prescription that she could try. Please advise.

## 2020-04-23 ENCOUNTER — Other Ambulatory Visit: Payer: Self-pay

## 2020-04-23 DIAGNOSIS — L111 Transient acantholytic dermatosis [Grover]: Secondary | ICD-10-CM

## 2020-04-23 MED ORDER — TACROLIMUS 0.1 % EX OINT: TOPICAL_OINTMENT | CUTANEOUS | 3 refills | Status: DC

## 2020-04-23 MED ORDER — HALOBETASOL PROPIONATE 0.05 % EX FOAM: CUTANEOUS | 3 refills | Status: DC

## 2020-04-23 NOTE — Telephone Encounter (Signed)
I would recommend halobetasol foam for 2 weeks, then switch to halobetasol foam on weekends only and add tacrolimus twice daily as needed on weekdays. Thank you

## 2020-04-23 NOTE — Telephone Encounter (Signed)
Pt informed. Rx sent to pharmacy  

## 2020-04-23 NOTE — Progress Notes (Signed)
New rx

## 2020-06-17 ENCOUNTER — Other Ambulatory Visit: Payer: Self-pay | Admitting: Obstetrics and Gynecology

## 2020-06-17 DIAGNOSIS — Z1231 Encounter for screening mammogram for malignant neoplasm of breast: Secondary | ICD-10-CM

## 2020-07-03 ENCOUNTER — Ambulatory Visit: Payer: BC Managed Care – PPO | Admitting: Dermatology

## 2020-07-09 ENCOUNTER — Encounter: Payer: Self-pay | Admitting: Dermatology

## 2020-07-09 ENCOUNTER — Ambulatory Visit: Payer: BC Managed Care – PPO | Admitting: Dermatology

## 2020-07-09 ENCOUNTER — Other Ambulatory Visit: Payer: Self-pay

## 2020-07-09 DIAGNOSIS — C44719 Basal cell carcinoma of skin of left lower limb, including hip: Secondary | ICD-10-CM

## 2020-07-09 DIAGNOSIS — Z872 Personal history of diseases of the skin and subcutaneous tissue: Secondary | ICD-10-CM

## 2020-07-09 DIAGNOSIS — L814 Other melanin hyperpigmentation: Secondary | ICD-10-CM

## 2020-07-09 MED ORDER — MUPIROCIN 2 % EX OINT: TOPICAL_OINTMENT | CUTANEOUS | 0 refills | Status: DC

## 2020-07-09 NOTE — Patient Instructions (Signed)

## 2020-07-09 NOTE — Progress Notes (Signed)
   Follow-Up Visit   Subjective  Brooke Lindsey is a 59 y.o. female who presents for the following: Follow-up (Biopsy proven BCC nodular pattern at L distal shin 03/20/2020 pt here for treatment ) and Actinic Keratosis (Biopsy proven pigmented Ak at right distal calf 03/20/2020 pt here for treatment ).   The following portions of the chart were reviewed this encounter and updated as appropriate:  Tobacco  Allergies  Meds  Problems  Med Hx  Surg Hx  Fam Hx      Review of Systems:  No other skin or systemic complaints except as noted in HPI or Assessment and Plan.  Objective  Well appearing patient in no apparent distress; mood and affect are within normal limits.  A focused examination was performed including legs. Relevant physical exam findings are noted in the Assessment and Plan.  Objective  Left distal shin: Pink healing patch    Assessment & Plan  Basal cell carcinoma (BCC) of skin of left lower extremity including hip Left distal shin  Destruction of lesion Complexity: extensive   Destruction method: electrodesiccation and curettage   Informed consent: discussed and consent obtained   Timeout:  patient name, date of birth, surgical site, and procedure verified Procedure prep:  Patient was prepped and draped in usual sterile fashion Prep type:  Isopropyl alcohol Anesthesia: the lesion was anesthetized in a standard fashion   Anesthetic:  1% lidocaine w/ epinephrine 1-100,000 buffered w/ 8.4% NaHCO3 Curettage performed in three different directions: Yes   Electrodesiccation performed over the curetted area: Yes   Lesion length (cm):  0.5 Lesion width (cm):  0.5 Margin per side (cm):  0.3 Final wound size (cm):  1.1 Hemostasis achieved with:  pressure, aluminum chloride and electrodesiccation Outcome: patient tolerated procedure well with no complications   Post-procedure details: sterile dressing applied and wound care instructions given   Dressing type: bandage and  petrolatum    History of PreCancerous Actinic Keratosis  - site of biopsy proven PreCancerous Actinic Keratosis at right distal calf clear today. - these may recur and new lesions may form requiring treatment to prevent transformation into skin cancer - observe for new or changing spots and contact [facility] for appointment if occur - photoprotection with sun protective clothing; sunglasses and broad spectrum sunscreen with SPF of at least 30 + and frequent self skin exams recommended - yearly exams by a dermatologist recommended for persons with history of PreCancerous Actinic Keratoses  Lentigines - Scattered tan macules - Discussed due to sun exposure - Benign, observe - Call for any changes  Return for as scheduled in Feb 2022 for TBSE .  I, Marye Round, CMA, am acting as scribe for Forest Gleason, MD .  Documentation: I have reviewed the above documentation for accuracy and completeness, and I agree with the above.  Forest Gleason, MD

## 2020-07-29 ENCOUNTER — Other Ambulatory Visit: Payer: Self-pay

## 2020-07-29 ENCOUNTER — Ambulatory Visit
Admission: RE | Admit: 2020-07-29 | Discharge: 2020-07-29 | Disposition: A | Discharge status: Home / Self Care | Payer: BC Managed Care – PPO | Source: Ambulatory Visit | Attending: Obstetrics and Gynecology | Admitting: Obstetrics and Gynecology

## 2020-07-29 DIAGNOSIS — Z1231 Encounter for screening mammogram for malignant neoplasm of breast: Secondary | ICD-10-CM | POA: Insufficient documentation

## 2020-09-18 ENCOUNTER — Ambulatory Visit: Payer: BC Managed Care – PPO | Admitting: Dermatology

## 2020-09-18 ENCOUNTER — Other Ambulatory Visit: Payer: Self-pay

## 2020-09-18 ENCOUNTER — Encounter: Payer: Self-pay | Admitting: Dermatology

## 2020-09-18 DIAGNOSIS — L821 Other seborrheic keratosis: Secondary | ICD-10-CM

## 2020-09-18 DIAGNOSIS — L111 Transient acantholytic dermatosis [Grover]: Secondary | ICD-10-CM

## 2020-09-18 DIAGNOSIS — Z85828 Personal history of other malignant neoplasm of skin: Secondary | ICD-10-CM

## 2020-09-18 DIAGNOSIS — L814 Other melanin hyperpigmentation: Secondary | ICD-10-CM

## 2020-09-18 DIAGNOSIS — D369 Benign neoplasm, unspecified site: Secondary | ICD-10-CM | POA: Diagnosis not present

## 2020-09-18 DIAGNOSIS — Z1283 Encounter for screening for malignant neoplasm of skin: Secondary | ICD-10-CM | POA: Diagnosis not present

## 2020-09-18 DIAGNOSIS — D1800 Hemangioma unspecified site: Secondary | ICD-10-CM

## 2020-09-18 DIAGNOSIS — Z872 Personal history of diseases of the skin and subcutaneous tissue: Secondary | ICD-10-CM

## 2020-09-18 DIAGNOSIS — L578 Other skin changes due to chronic exposure to nonionizing radiation: Secondary | ICD-10-CM

## 2020-09-18 DIAGNOSIS — D229 Melanocytic nevi, unspecified: Secondary | ICD-10-CM

## 2020-09-18 NOTE — Progress Notes (Signed)
Follow-Up Visit   Subjective  Brooke Lindsey is a 60 y.o. female who presents for the following: TBSE (Patient here for full body skin exam and skin cancer screening. Patient with history of BCC and AK. Nothing new or changing today that patient is aware of. ).  Her Grover's at her chest is doing well.  The following portions of the chart were reviewed this encounter and updated as appropriate:   Tobacco  Allergies  Meds  Problems  Med Hx  Surg Hx  Fam Hx      Review of Systems:  No other skin or systemic complaints except as noted in HPI or Assessment and Plan.  Objective  Well appearing patient in no apparent distress; mood and affect are within normal limits.  A full examination was performed including scalp, head, eyes, ears, nose, lips, neck, chest, axillae, abdomen, back, buttocks, bilateral upper extremities, bilateral lower extremities, hands, feet, fingers, toes, fingernails, and toenails. All findings within normal limits unless otherwise noted below.  Objective  left alar crease, right alar crease:   Objective  chest: Few pink papules at chest   Assessment & Plan  Angiofibroma left alar crease, right alar crease  Benign-appearing.  Observation.  Call clinic for new or changing lesions.  Recommend daily use of broad spectrum spf 30+ sunscreen to sun-exposed areas.    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 Currently well-controlled.  Continue halobetasol twice daily for up to 2 weeks as needed. Avoid applying to face, groin, and axilla. Use as directed. Risk of skin atrophy with long-term use reviewed.   Topical steroids (such as triamcinolone, fluocinolone, fluocinonide, mometasone, clobetasol, halobetasol, betamethasone, hydrocortisone) can cause thinning and lightening of the skin if they are used for too long in the same area. Your physician has selected the right strength medicine for your problem and area affected on the  body. Please use your medication only as directed by your physician to prevent side effects.    Other Related Medications Halobetasol Propionate 0.05 % FOAM tacrolimus (PROTOPIC) 0.1 % ointment   Lentigines - Scattered tan macules, right nasofacial angle - Discussed due to sun exposure - Benign, observe - Call for any changes  Seborrheic Keratoses - Stuck-on, waxy, tan-brown papules and plaques  - Discussed benign etiology and prognosis. - Observe - Call for any changes  Melanocytic Nevi - Tan-brown and/or pink-flesh-colored symmetric macules and papules - Benign appearing on exam today - Observation - Call clinic for new or changing moles - Recommend daily use of broad spectrum spf 30+ sunscreen to sun-exposed areas.   Hemangiomas - Red papules - Discussed benign nature - Observe - Call for any changes  Actinic Damage - Chronic, secondary to cumulative UV/sun exposure - diffuse scaly erythematous macules with underlying dyspigmentation - Recommend daily broad spectrum sunscreen SPF 30+ to sun-exposed areas, reapply every 2 hours as needed.  - Call for new or changing lesions.  Skin cancer screening performed today.  History of Basal Cell Carcinoma of the Skin - No evidence of recurrence today - Recommend regular full body skin exams - Recommend daily broad spectrum sunscreen SPF 30+ to sun-exposed areas, reapply every 2 hours as needed.  - Call if any new or changing lesions are noted between office visits  Return in about 6 months (around 03/18/2021) for TBSE.  Graciella Belton, RMA, am acting as scribe for Forest Gleason, MD .  Documentation: I have reviewed the above documentation for accuracy and completeness, and  I agree with the above.  Forest Gleason, MD

## 2020-09-18 NOTE — Patient Instructions (Addendum)
Melanoma ABCDEs  Melanoma is the most dangerous type of skin cancer, and is the leading cause of death from skin disease.  You are more likely to develop melanoma if you:  Have light-colored skin, light-colored eyes, or red or blond hair  Spend a lot of time in the sun  Tan regularly, either outdoors or in a tanning bed  Have had blistering sunburns, especially during childhood  Have a close family member who has had a melanoma  Have atypical moles or large birthmarks  Early detection of melanoma is key since treatment is typically straightforward and cure rates are extremely high if we catch it early.   The first sign of melanoma is often a change in a mole or a new dark spot.  The ABCDE system is a way of remembering the signs of melanoma.  A for asymmetry:  The two halves do not match. B for border:  The edges of the growth are irregular. C for color:  A mixture of colors are present instead of an even brown color. D for diameter:  Melanomas are usually (but not always) greater than 11mm - the size of a pencil eraser. E for evolution:  The spot keeps changing in size, shape, and color.  Please check your skin once per month between visits. You can use a small mirror in front and a large mirror behind you to keep an eye on the back side or your body.   If you see any new or changing lesions before your next follow-up, please call to schedule a visit.  Please continue daily skin protection including broad spectrum sunscreen SPF 30+ to sun-exposed areas, reapplying every 2 hours as needed when you're outdoors.    Recommend taking Heliocare sun protection supplement daily in sunny weather for additional sun protection. For maximum protection on the sunniest days, you can take up to 2 capsules of regular Heliocare OR take 1 capsule of Heliocare Ultra. For prolonged exposure (such as a full day in the sun), you can repeat your dose of the supplement 4 hours after your first dose.  Heliocare can be purchased at [facility] or at [email or web address].    Recommend Nicotinamide 500mg  twice per day to lower risk of non-melanoma skin cancer by approximately 25%.    Topical steroids (such as triamcinolone, fluocinolone, fluocinonide, mometasone, clobetasol, halobetasol, betamethasone, hydrocortisone) can cause thinning and lightening of the skin if they are used for too long in the same area. Your physician has selected the right strength medicine for your problem and area affected on the body. Please use your medication only as directed by your physician to prevent side effects.

## 2021-04-02 ENCOUNTER — Other Ambulatory Visit: Payer: Self-pay

## 2021-04-02 ENCOUNTER — Encounter: Payer: Self-pay | Admitting: Dermatology

## 2021-04-02 ENCOUNTER — Ambulatory Visit: Payer: BC Managed Care – PPO | Admitting: Dermatology

## 2021-04-02 DIAGNOSIS — Z1283 Encounter for screening for malignant neoplasm of skin: Secondary | ICD-10-CM

## 2021-04-02 DIAGNOSIS — L111 Transient acantholytic dermatosis [Grover]: Secondary | ICD-10-CM | POA: Diagnosis not present

## 2021-04-02 DIAGNOSIS — D1800 Hemangioma unspecified site: Secondary | ICD-10-CM

## 2021-04-02 DIAGNOSIS — Z872 Personal history of diseases of the skin and subcutaneous tissue: Secondary | ICD-10-CM

## 2021-04-02 DIAGNOSIS — Z85828 Personal history of other malignant neoplasm of skin: Secondary | ICD-10-CM | POA: Diagnosis not present

## 2021-04-02 DIAGNOSIS — L578 Other skin changes due to chronic exposure to nonionizing radiation: Secondary | ICD-10-CM

## 2021-04-02 DIAGNOSIS — L821 Other seborrheic keratosis: Secondary | ICD-10-CM

## 2021-04-02 DIAGNOSIS — D229 Melanocytic nevi, unspecified: Secondary | ICD-10-CM

## 2021-04-02 DIAGNOSIS — L814 Other melanin hyperpigmentation: Secondary | ICD-10-CM

## 2021-04-02 NOTE — Patient Instructions (Signed)
Melanoma ABCDEs  Melanoma is the most dangerous type of skin cancer, and is the leading cause of death from skin disease.  You are more likely to develop melanoma if you: Have light-colored skin, light-colored eyes, or red or blond hair Spend a lot of time in the sun Tan regularly, either outdoors or in a tanning bed Have had blistering sunburns, especially during childhood Have a close family member who has had a melanoma Have atypical moles or large birthmarks  Early detection of melanoma is key since treatment is typically straightforward and cure rates are extremely high if we catch it early.   The first sign of melanoma is often a change in a mole or a new dark spot.  The ABCDE system is a way of remembering the signs of melanoma.  A for asymmetry:  The two halves do not match. B for border:  The edges of the growth are irregular. C for color:  A mixture of colors are present instead of an even brown color. D for diameter:  Melanomas are usually (but not always) greater than 74m - the size of a pencil eraser. E for evolution:  The spot keeps changing in size, shape, and color.  Please check your skin once per month between visits. You can use a small mirror in front and a large mirror behind you to keep an eye on the back side or your body.   If you see any new or changing lesions before your next follow-up, please call to schedule a visit.  Please continue daily skin protection including broad spectrum sunscreen SPF 30+ to sun-exposed areas, reapplying every 2 hours as needed when you're outdoors.    Recommend taking Heliocare sun protection supplement daily in sunny weather for additional sun protection. For maximum protection on the sunniest days, you can take up to 2 capsules of regular Heliocare OR take 1 capsule of Heliocare Ultra. For prolonged exposure (such as a full day in the sun), you can repeat your dose of the supplement 4 hours after your first dose. Heliocare [facility] be  purchased at [facility] at [email or web address]    Recommend daily broad spectrum sunscreen SPF 30+ to sun-exposed areas, reapply every 2 hours as needed. Call for new or changing lesions.  Staying in the shade or wearing long sleeves, sun glasses (UVA+UVB protection) and wide brim hats (4-inch brim around the entire circumference of the hat) are also recommended for sun protection.   If you have any questions or concerns for your doctor, please call our main line at [phone] press option 4 to reach your doctor's medical assistant. If no one answers, please leave a voicemail as directed and we will return your call as soon as possible. Messages left after 4 pm will be answered the following business day.   You may also send [place] message via [employer] We typically respond to MyChart messages within 1-2 business days.  For prescription refills, please ask your pharmacy to contact our office. Our fax number is [phone]  If you have an urgent issue when the clinic is closed that cannot wait until the next business day, you can page your doctor at the number below.    Please note that while we do our best to be available for urgent issues outside of office hours, we are not available 24/7.   If you have an urgent issue and are unable to reach [place] you may choose to seek medical care at your doctor's  office, retail clinic, urgent care center, or emergency room.  If you have a medical emergency, please immediately call 911 or go to the emergency department.  Pager Numbers  - Dr. Nehemiah Massed: [phone]  - Dr. Laurence Ferrari: [phone]  - Dr. Nicole Kindred: [phone]  In the event of inclement weather, please call our main line at [phone] for an update on the status of any delays or closures.  Dermatology Medication Tips: Please keep the boxes that topical medications come in in order to help keep track of the instructions about where and how to use these. Pharmacies typically  print the medication instructions only on the boxes and not directly on the medication tubes.   If your medication is too expensive, please contact our office at [phone] option 4 or send [place] a message through [facility].   We are unable to tell what your co-pay for medications will be in advance as this is different depending on your insurance coverage. However, we may be able to find a substitute medication at lower cost or fill out paperwork to get insurance to cover a needed medication.   If a prior authorization is required to get your medication covered by your insurance company, please allow [place] 1-2 business days to complete this process.  Drug prices often vary depending on where the prescription is filled and some pharmacies may offer cheaper prices.  The website www.goodrx.com contains coupons for medications through different pharmacies. The prices here do not account for what the cost may be with help from insurance (it may be cheaper with your insurance), but the website can give you the price if you did not use any insurance.  - You can print the associated coupon and take it with your prescription to the pharmacy.  - You may also stop by our office during regular business hours and pick up a GoodRx coupon card.  - If you need your prescription sent electronically to a different pharmacy, notify our office through [facility] or by phone at [phone] option 4.

## 2021-04-02 NOTE — Progress Notes (Signed)
Follow-Up Visit   Subjective  Brooke Lindsey is a 60 y.o. female who presents for the following: TBSE (Patient here for full body skin exam and skin cancer screening. Patient with hx of BCC and AK's. She is not aware of any new or changing spots.).  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 Patient usees halobetasol with flares.   The following portions of the chart were reviewed this encounter and updated as appropriate:   Tobacco  Allergies  Meds  Problems  Med Hx  Surg Hx  Fam Hx      Review of Systems:  No other skin or systemic complaints except as noted in HPI or Assessment and Plan.  Objective  Well appearing patient in no apparent distress; mood and affect are within normal limits.  A full examination was performed including scalp, head, eyes, ears, nose, lips, neck, chest, axillae, abdomen, back, buttocks, bilateral upper extremities, bilateral lower extremities, hands, feet, fingers, toes, fingernails, and toenails. All findings within normal limits unless otherwise noted below.  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 Currently well-controlled. Only flares occasionally.  Advised no cure, only control.  Continue halobetasol foam twice daily for up to 2 weeks as needed for flares. Avoid applying to face, groin, and axilla. Use as directed. Risk of skin atrophy with long-term use reviewed.   Topical steroids (such as triamcinolone, fluocinolone, fluocinonide, mometasone, clobetasol, halobetasol, betamethasone, hydrocortisone) can cause thinning and lightening of the skin if they are used for too long in the same area. Your physician has selected the right strength medicine for your problem and area affected on the body. Please use your medication only as directed by your physician to prevent side effects.    Related  Medications Halobetasol Propionate 0.05 % FOAM Use on Affected area up to twice daily as needed up to 2 weeks  Lentigines - Scattered tan macules - Due to sun exposure - Benign-appering, observe - Recommend daily broad spectrum sunscreen SPF 30+ to sun-exposed areas, reapply every 2 hours as needed. - Call for any changes  Seborrheic Keratoses - Stuck-on, waxy, tan-brown papules and/or plaques  - Benign-appearing - Discussed benign etiology and prognosis. - Observe - Call for any changes  Melanocytic Nevi - Tan-brown and/or pink-flesh-colored symmetric macules and papules - Benign appearing on exam today - Observation - Call clinic for new or changing moles - Recommend daily use of broad spectrum spf 30+ sunscreen to sun-exposed areas.   Hemangiomas - Red papules - Discussed benign nature - Observe - Call for any changes  Actinic Damage - Chronic condition, secondary to cumulative UV/sun exposure - diffuse scaly erythematous macules with underlying dyspigmentation - Recommend daily broad spectrum sunscreen SPF 30+ to sun-exposed areas, reapply every 2 hours as needed.  - Staying in the shade or wearing long sleeves, sun glasses (UVA+UVB protection) and wide brim hats (4-inch brim around the entire circumference of the hat) are also recommended for sun protection.  - Call for new or changing lesions.  Skin cancer screening performed today.  History of PreCancerous Actinic Keratosis  - site(s) of PreCancerous Actinic Keratosis clear today. - these may recur and new lesions may form requiring treatment to prevent transformation into skin cancer - observe for new or changing spots and contact [place] for appointment if occur - photoprotection with sun protective clothing; sunglasses and broad  spectrum sunscreen with SPF of at least 30 + and frequent self skin exams recommended - yearly exams by a dermatologist recommended for persons with history of PreCancerous  Actinic Keratoses  History of Basal Cell Carcinoma of the Skin - No evidence of recurrence today - Recommend regular full body skin exams - Recommend daily broad spectrum sunscreen SPF 30+ to sun-exposed areas, reapply every 2 hours as needed.  - Call if any new or changing lesions are noted between office visits  Return in about 6 months (around 10/03/2021) for TBSE.  Graciella Belton, RMA, am acting as scribe for Forest Gleason, MD .  Documentation: I have reviewed the above documentation for accuracy and completeness, and I agree with the above.  Forest Gleason, MD

## 2021-05-02 ENCOUNTER — Other Ambulatory Visit: Payer: Self-pay | Admitting: Obstetrics and Gynecology

## 2021-05-02 DIAGNOSIS — Z1231 Encounter for screening mammogram for malignant neoplasm of breast: Secondary | ICD-10-CM

## 2021-08-15 IMAGING — MG DIGITAL SCREENING BILAT W/ TOMO W/ CAD
8 series · 9 of 24 positions shown · non-contrast
Comparison: Previous exam(s).

CLINICAL DATA: Screening.

EXAM:
DIGITAL SCREENING BILATERAL MAMMOGRAM WITH TOMO AND CAD

[R MLO synth-2D]
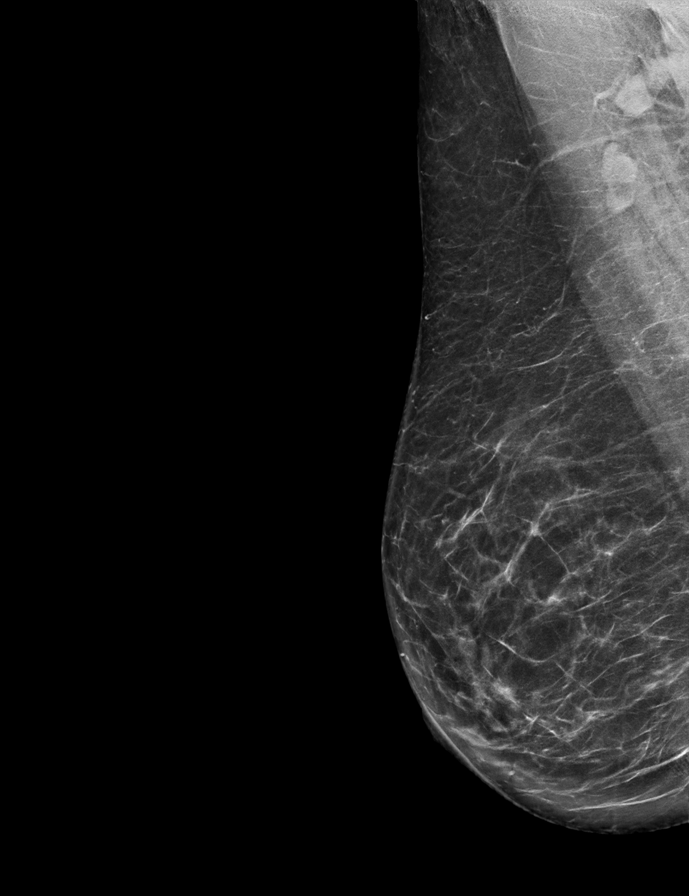

[L CC synth-2D]
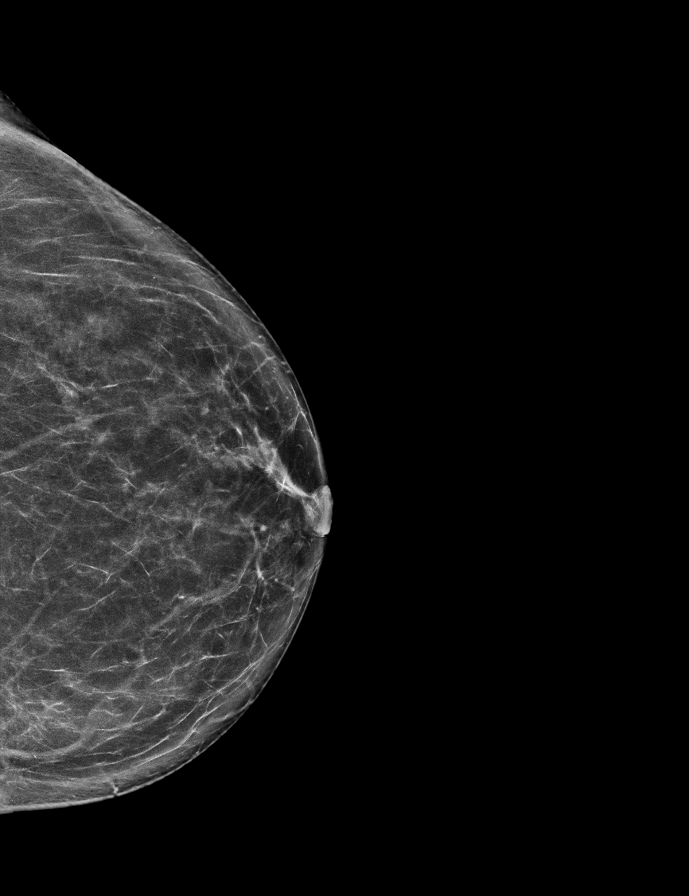

[L MLO synth-2D]
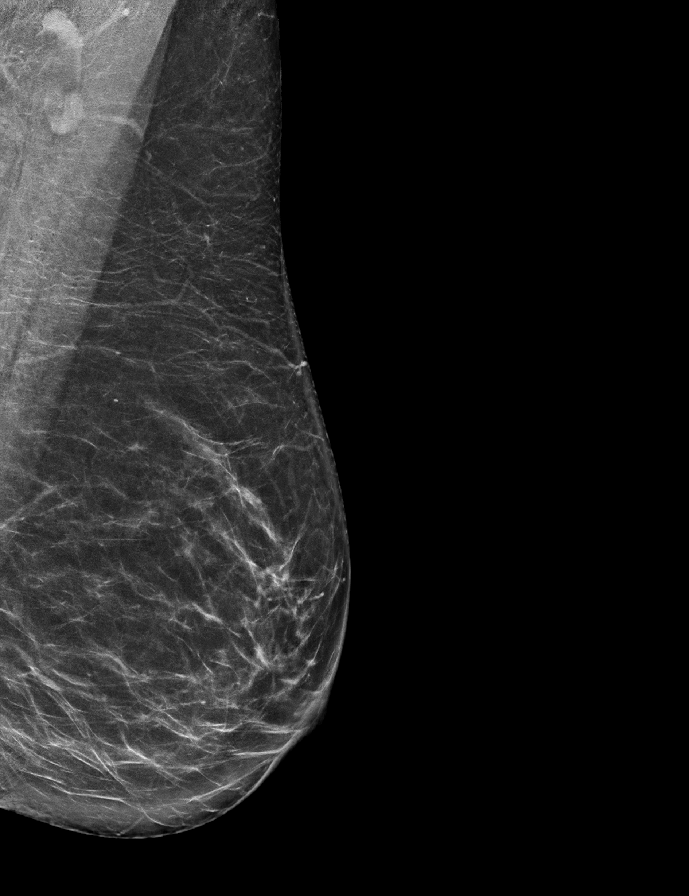

[R CC synth-2D]
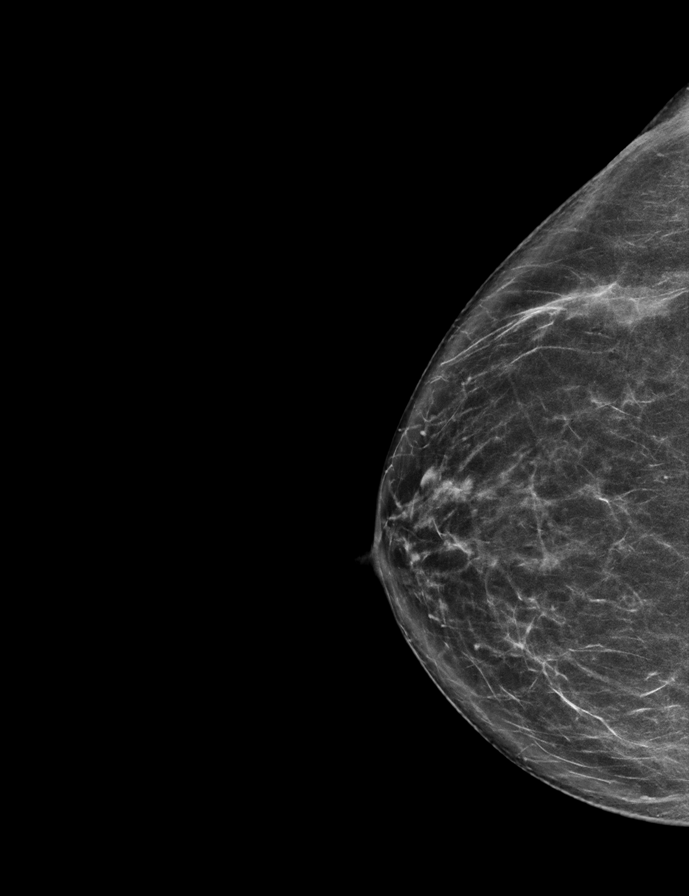

[L MLO tomo · 2 of 78 frames shown]
[frame 26/78]
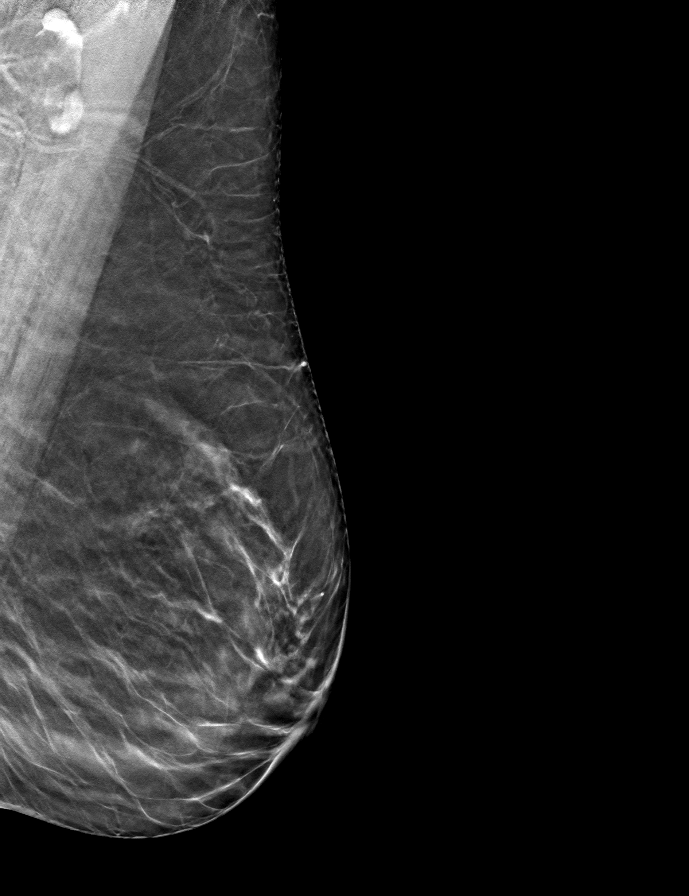
[frame 39/78]
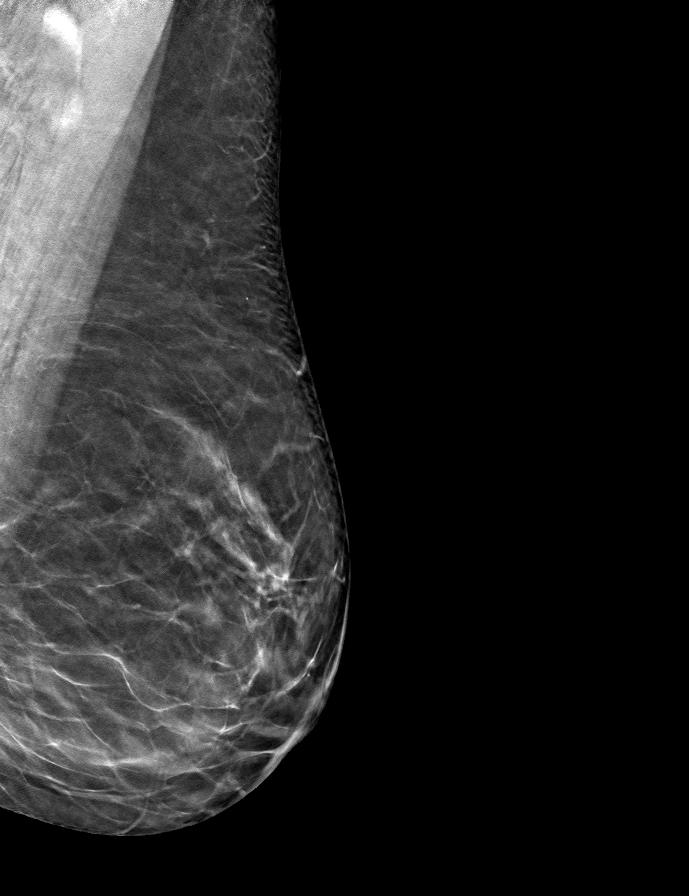

[R MLO tomo · tomo slice 41/82.0]
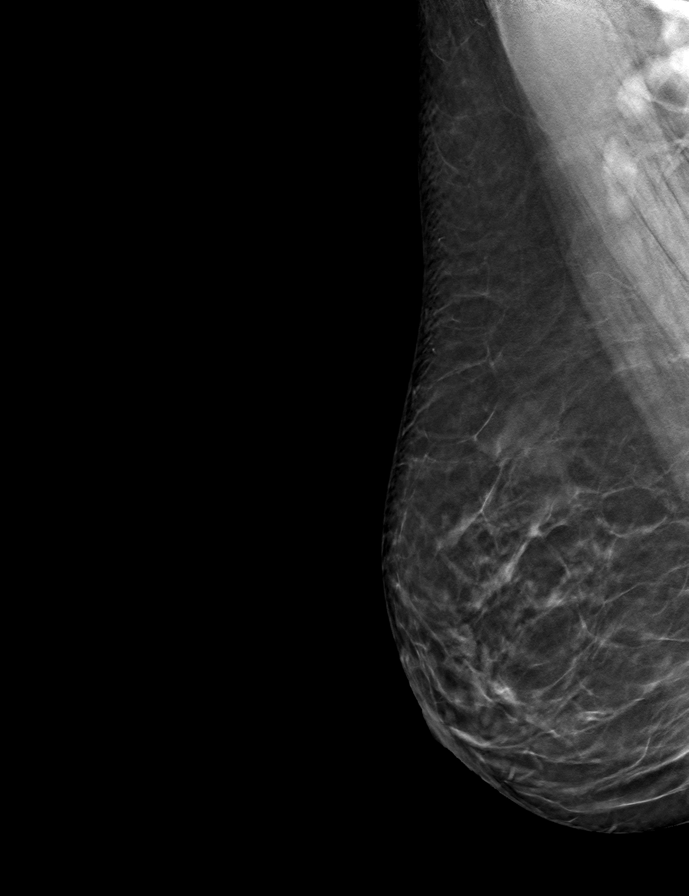

[R CC tomo · tomo slice 35/70.0]
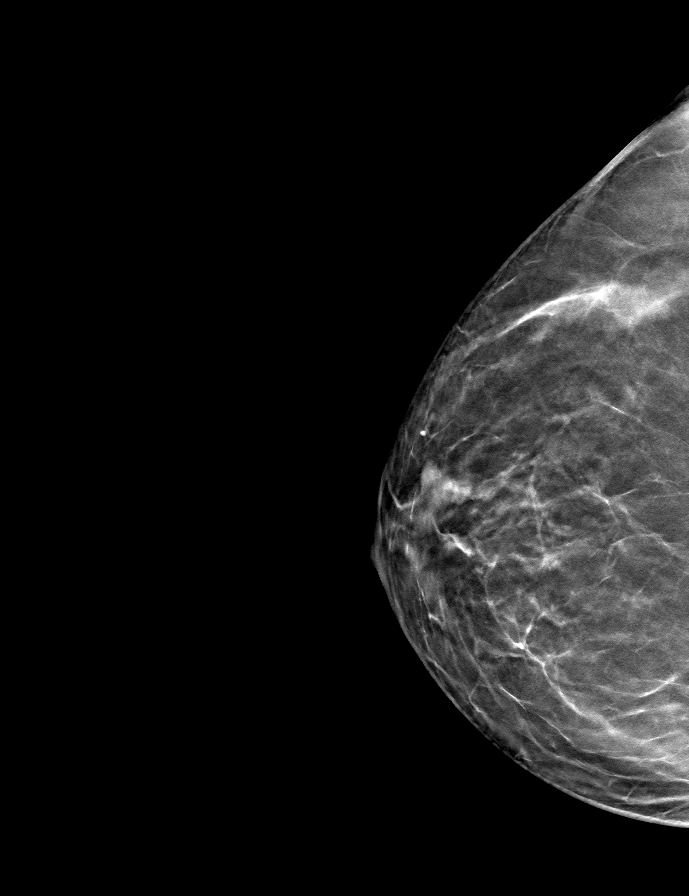

[L CC tomo · tomo slice 35/68.0]
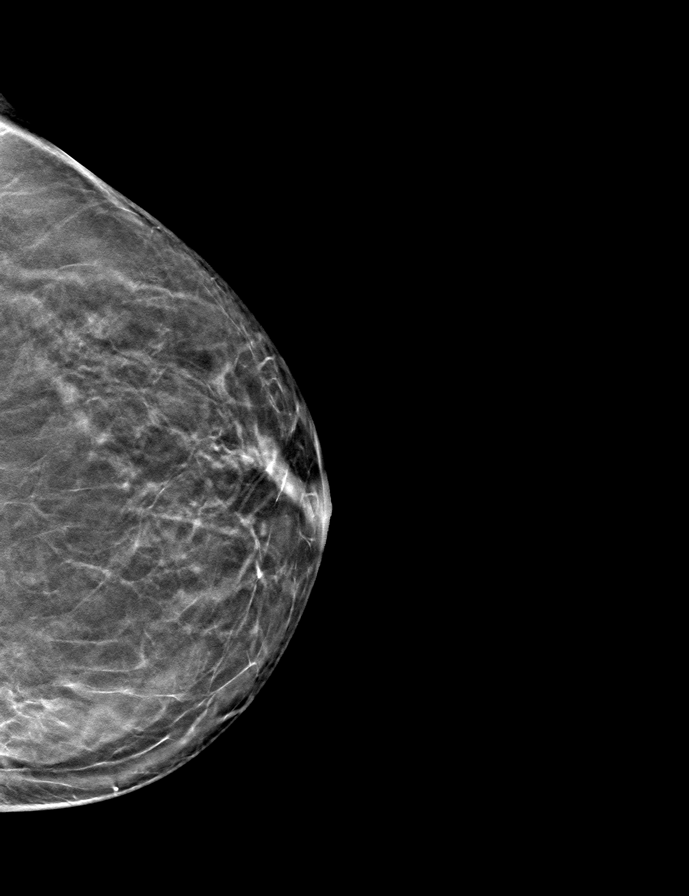

[9 of 24 positions shown; findings below may reference images not displayed]

ACR Breast Density Category b: There are scattered areas of
fibroglandular density.
FINDINGS: There are no findings suspicious for malignancy. Images were
processed with CAD.
IMPRESSION: No mammographic evidence of malignancy. A result letter of this
screening mammogram will be mailed directly to the patient.

RECOMMENDATION:
Screening mammogram in one year. (Code:[id number])

BI-RADS CATEGORY  1: Negative.

## 2021-09-17 LAB — HM PAP SMEAR: HM Pap smear: NORMAL

## 2021-10-02 ENCOUNTER — Ambulatory Visit: Payer: BC Managed Care – PPO | Admitting: Dermatology

## 2021-10-02 ENCOUNTER — Other Ambulatory Visit: Payer: Self-pay

## 2021-10-02 DIAGNOSIS — D2339 Other benign neoplasm of skin of other parts of face: Secondary | ICD-10-CM | POA: Diagnosis not present

## 2021-10-02 DIAGNOSIS — D369 Benign neoplasm, unspecified site: Secondary | ICD-10-CM

## 2021-10-02 DIAGNOSIS — L111 Transient acantholytic dermatosis [Grover]: Secondary | ICD-10-CM | POA: Diagnosis not present

## 2021-10-02 DIAGNOSIS — Z1283 Encounter for screening for malignant neoplasm of skin: Secondary | ICD-10-CM

## 2021-10-02 DIAGNOSIS — Z85828 Personal history of other malignant neoplasm of skin: Secondary | ICD-10-CM

## 2021-10-02 DIAGNOSIS — L814 Other melanin hyperpigmentation: Secondary | ICD-10-CM

## 2021-10-02 DIAGNOSIS — D229 Melanocytic nevi, unspecified: Secondary | ICD-10-CM

## 2021-10-02 DIAGNOSIS — D2372 Other benign neoplasm of skin of left lower limb, including hip: Secondary | ICD-10-CM

## 2021-10-02 DIAGNOSIS — L578 Other skin changes due to chronic exposure to nonionizing radiation: Secondary | ICD-10-CM

## 2021-10-02 DIAGNOSIS — L821 Other seborrheic keratosis: Secondary | ICD-10-CM

## 2021-10-02 DIAGNOSIS — D1800 Hemangioma unspecified site: Secondary | ICD-10-CM

## 2021-10-02 NOTE — Patient Instructions (Addendum)
Recommend Niacinamide or Nicotinamide 500mg  twice per day to lower risk of non-melanoma skin cancer by approximately 25%. This is usually available at [employer].    Recommend taking Heliocare sun protection supplement daily in sunny weather for additional sun protection. For maximum protection on the sunniest days, you can take up to 2 capsules of regular Heliocare OR take 1 capsule of Heliocare Ultra. For prolonged exposure (such as a full day in the sun), you can repeat your dose of the supplement 4 hours after your first dose. Heliocare can be purchased at [employer], at some [facility] or at [email or web address].    Melanoma ABCDEs  Melanoma is the most dangerous type of skin cancer, and is the leading cause of death from skin disease.  You are more likely to develop melanoma if you: Have light-colored skin, light-colored eyes, or red or blond hair Spend a lot of time in the sun Tan regularly, either outdoors or in a tanning bed Have had blistering sunburns, especially during childhood Have a close family member who has had a melanoma Have atypical moles or large birthmarks  Early detection of melanoma is key since treatment is typically straightforward and cure rates are extremely high if we catch it early.   The first sign of melanoma is often a change in a mole or a new dark spot.  The ABCDE system is a way of remembering the signs of melanoma.  A for asymmetry:  The two halves do not match. B for border:  The edges of the growth are irregular. C for color:  A mixture of colors are present instead of an even brown color. D for diameter:  Melanomas are usually (but not always) greater than 75mm - the size of a pencil eraser. E for evolution:  The spot keeps changing in size, shape, and color.  Please check your skin once per month between visits. You can use a small mirror in front and a large mirror behind you to keep an eye on the back side or your body.   If you see  any new or changing lesions before your next follow-up, please call to schedule a visit.  Please continue daily skin protection including broad spectrum sunscreen SPF 30+ to sun-exposed areas, reapplying every 2 hours as needed when you're outdoors.   Staying in the shade or wearing long sleeves, sun glasses (UVA+UVB protection) and wide brim hats (4-inch brim around the entire circumference of the hat) are also recommended for sun protection.    If You Need Anything After Your Visit  If you have any questions or concerns for your doctor, please call our main line at [phone] and press option 4 to reach your doctor's medical assistant. If no one answers, please leave a voicemail as directed and we will return your call as soon as possible. Messages left after 4 pm will be answered the following business day.   You may also send [place] a message via [place]. We typically respond to MyChart messages within 1-2 business days.  For prescription refills, please ask your pharmacy to contact our office. Our fax number is [phone].  If you have an urgent issue when the clinic is closed that cannot wait until the next business day, you can page your doctor at the number below.    Please note that while we do our best to be available for urgent issues outside of office hours, we are not available 24/7.   If you have an urgent  issue and are unable to reach [place], you may choose to seek medical care at your doctor's office, retail clinic, urgent care center, or emergency room.  If you have a medical emergency, please immediately call 911 or go to the emergency department.  Pager Numbers  - Dr. Nehemiah Massed: [phone]  - Dr. Laurence Ferrari: [phone]  - Dr. Nicole Kindred: [phone]  In the event of inclement weather, please call our main line at [phone] for an update on the status of any delays or closures.  Dermatology Medication Tips: Please keep the boxes that topical medications come in in order  to help keep track of the instructions about where and how to use these. Pharmacies typically print the medication instructions only on the boxes and not directly on the medication tubes.   If your medication is too expensive, please contact our office at [phone] option 4 or send [place] a message through [place].   We are unable to tell what your co-pay for medications will be in advance as this is different depending on your insurance coverage. However, we may be able to find a substitute medication at lower cost or fill out paperwork to get insurance to cover a needed medication.   If a prior authorization is required to get your medication covered by your insurance company, please allow [place] 1-2 business days to complete this process.  Drug prices often vary depending on where the prescription is filled and some pharmacies may offer cheaper prices.  The website www.goodrx.com contains coupons for medications through different pharmacies. The prices here do not account for what the cost may be with help from insurance (it may be cheaper with your insurance), but the website can give you the price if you did not use any insurance.  - You can print the associated coupon and take it with your prescription to the pharmacy.  - You may also stop by our office during regular business hours and pick up a GoodRx coupon card.  - If you need your prescription sent electronically to a different pharmacy, notify our office through [facility] or by phone at [phone] option 4.     Si Usted Necesita Algo Despus de Su Visita  Tambin puede enviarnos un mensaje a travs de Pharmacist, community. Por lo general respondemos a los mensajes de MyChart en el transcurso de 1 a 2 das hbiles.  Para renovar recetas, por favor pida a su farmacia que se ponga en contacto con nuestra oficina. Harland Dingwall de fax es [place] [phone].  Si tiene un asunto urgente cuando la clnica est cerrada y que no puede esperar  hasta el siguiente da hbil, puede llamar/localizar a su doctor(a) al nmero que aparece a continuacin.   Por favor, tenga en cuenta que aunque hacemos todo lo posible para estar disponibles para asuntos urgentes fuera del horario de [place], no estamos disponibles las 24 horas del da, los 7 das de la [place].   Si tiene un problema urgente y no puede comunicarse con nosotros, puede optar por buscar atencin mdica  en el consultorio de su doctor(a), en una clnica privada, en un centro de atencin urgente o en una sala de emergencias.  Si tiene Engineering geologist, por favor llame inmediatamente al 911 o vaya a la sala de emergencias.  Nmeros de bper  - Dr. Nehemiah Massed: [phone]  - Dra. Moye: [phone]  - Dra. Nicole Kindred: [phone]  En caso de inclemencias del [place], por favor llame a nuestra lnea principal al [phone] para Ardelia Mems actualizacin  sobre el estado de cualquier retraso o cierre.  Consejos para la medicacin en dermatologa: Por favor, guarde las cajas en las que vienen los medicamentos de uso tpico para ayudarle a seguir las instrucciones sobre dnde y cmo usarlos. Las farmacias generalmente imprimen las instrucciones del medicamento slo en las cajas y no directamente en los tubos del [employer].   Si su medicamento es muy caro, por favor, pngase en contacto con Zigmund Daniel llamando al [phone] y presione la opcin 4 o envenos un mensaje a travs de Pharmacist, community.   No podemos decirle cul ser su copago por los medicamentos por adelantado ya que esto es diferente dependiendo de la cobertura de su seguro. Sin embargo, es posible que podamos encontrar un medicamento sustituto a Electrical engineer un formulario para que el seguro cubra el medicamento que se considera necesario.   Si se requiere una autorizacin previa para que su compaa de seguros [place] su medicamento, por favor permtanos de 1 a 2 das hbiles para completar este proceso.  Los precios  de los medicamentos varan con frecuencia dependiendo del Environmental consultant de dnde se surte la receta y alguna farmacias pueden ofrecer precios ms baratos.  El sitio web www.goodrx.com tiene cupones para medicamentos de Airline pilot. Los precios aqu no tienen en cuenta lo que podra costar con la ayuda del seguro (puede ser ms barato con su seguro), pero el sitio web puede darle el precio si no utiliz Research scientist (physical sciences).  - Puede imprimir el cupn correspondiente y llevarlo con su receta a la farmacia.  - Tambin puede pasar por nuestra oficina durante el horario de atencin regular y Charity fundraiser una tarjeta de cupones de GoodRx.  - Si necesita que su receta se enve electrnicamente a una farmacia diferente, informe a nuestra oficina a travs de MyChart de [place] o por telfono llamando al [phone] y presione la opcin 4.

## 2021-10-02 NOTE — Progress Notes (Signed)
Follow-Up Visit   Subjective  Brooke Lindsey is a 61 y.o. female who presents for the following: Follow-up (6 month tbse. Patient has history of bcc and aks. ).  The patient presents for Total-Body Skin Exam (TBSE) for skin cancer screening and mole check.  The patient has spots, moles and lesions to be evaluated, some may be new or changing and the patient has concerns that these could be cancer.  The following portions of the chart were reviewed this encounter and updated as appropriate:  Tobacco   Allergies   Meds   Problems   Med Hx   Surg Hx   Fam Hx       Review of Systems: No other skin or systemic complaints except as noted in HPI or Assessment and Plan.   Objective  Well appearing patient in no apparent distress; mood and affect are within normal limits.  A full examination was performed including scalp, head, eyes, ears, nose, lips, neck, chest, axillae, abdomen, back, buttocks, bilateral upper extremities, bilateral lower extremities, hands, feet, fingers, toes, fingernails, and toenails. All findings within normal limits unless otherwise noted below.  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 Currently well-controlled. Only flares occasionally.   Advised no cure, only control.   Continue halobetasol foam twice daily for up to 2 weeks as needed for flares. Avoid applying to face, groin, and axilla. Use as directed. Risk of skin atrophy with long-term use reviewed.    Topical steroids (such as triamcinolone, fluocinolone, fluocinonide, mometasone, clobetasol, halobetasol, betamethasone, hydrocortisone) can cause thinning and lightening of the skin if they are used for too long in the same area. Your physician has selected the right strength medicine for your problem and area affected on the  body. Please use your medication only as directed by your physician to prevent side effects.      Angiofibroma (2) Left Alar Crease; Right Alar Crease  Benign-appearing.  Observation.  Call clinic for new or changing lesions.  Recommend daily use of broad spectrum spf 30+ sunscreen to sun-exposed areas.   Lentigines - Scattered tan macules - Due to sun exposure - Benign-appearing, observe - Recommend daily broad spectrum sunscreen SPF 30+ to sun-exposed areas, reapply every 2 hours as needed. - Call for any changes  Seborrheic Keratoses - Stuck-on, waxy, tan-brown papules and/or plaques  - Benign-appearing - Discussed benign etiology and prognosis. - Observe - Call for any changes  Melanocytic Nevi - Tan-brown and/or pink-flesh-colored symmetric macules and papules - Benign appearing on exam today - Observation - Call clinic for new or changing moles - Recommend daily use of broad spectrum spf 30+ sunscreen to sun-exposed areas.   Hemangiomas - Red papules - Discussed benign nature - Observe - Call for any changes  Dermatofibroma - Firm pink/brown papulenodule with dimple sign at left lateral knee - Benign appearing - Call for any changes  Actinic Damage - Chronic condition, secondary to cumulative UV/sun exposure - diffuse scaly erythematous macules with underlying dyspigmentation - Recommend daily broad spectrum sunscreen SPF 30+ to sun-exposed areas, reapply every 2 hours as needed.  - Staying in the shade or wearing long sleeves, sun glasses (UVA+UVB protection) and wide brim hats (4-inch brim around the entire circumference of the hat) are also recommended for sun protection.  - Call for new or changing  lesions.  History of Basal Cell Carcinoma of the Skin - No evidence of recurrence today left medial inferior shin 2021 - Recommend regular full body skin exams - Recommend daily broad spectrum sunscreen SPF 30+ to sun-exposed areas, reapply every 2 hours as  needed.  - Call if any new or changing lesions are noted between office visits  Skin cancer screening performed today.  I, Ruthell Rummage, CMA, am acting as scribe for Forest Gleason, MD.  Return for 6 month tbse .  Documentation: I have reviewed the above documentation for accuracy and completeness, and I agree with the above.  Forest Gleason, MD

## 2021-10-06 ENCOUNTER — Ambulatory Visit
Admission: RE | Admit: 2021-10-06 | Discharge: 2021-10-06 | Disposition: A | Discharge status: Home / Self Care | Payer: BC Managed Care – PPO | Source: Ambulatory Visit | Attending: Obstetrics and Gynecology | Admitting: Obstetrics and Gynecology

## 2021-10-06 ENCOUNTER — Other Ambulatory Visit: Payer: Self-pay

## 2021-10-06 ENCOUNTER — Encounter: Payer: Self-pay | Admitting: Dermatology

## 2021-10-06 DIAGNOSIS — Z1231 Encounter for screening mammogram for malignant neoplasm of breast: Secondary | ICD-10-CM | POA: Insufficient documentation

## 2021-11-19 ENCOUNTER — Encounter: Payer: Self-pay | Admitting: Internal Medicine

## 2021-11-19 ENCOUNTER — Ambulatory Visit: Payer: BC Managed Care – PPO | Admitting: Internal Medicine

## 2021-11-19 VITALS — BP 130/88 | HR 67 | Temp 98.0°F | Resp 14 | Ht 61.0 in | Wt 150.4 lb

## 2021-11-19 DIAGNOSIS — E039 Hypothyroidism, unspecified: Secondary | ICD-10-CM | POA: Diagnosis not present

## 2021-11-19 DIAGNOSIS — Z23 Encounter for immunization: Secondary | ICD-10-CM

## 2021-11-19 DIAGNOSIS — R030 Elevated blood-pressure reading, without diagnosis of hypertension: Secondary | ICD-10-CM

## 2021-11-19 DIAGNOSIS — R6889 Other general symptoms and signs: Secondary | ICD-10-CM | POA: Diagnosis not present

## 2021-11-19 DIAGNOSIS — M25562 Pain in left knee: Secondary | ICD-10-CM

## 2021-11-19 DIAGNOSIS — H40059 Ocular hypertension, unspecified eye: Secondary | ICD-10-CM

## 2021-11-19 DIAGNOSIS — L659 Nonscarring hair loss, unspecified: Secondary | ICD-10-CM

## 2021-11-19 DIAGNOSIS — G8929 Other chronic pain: Secondary | ICD-10-CM | POA: Insufficient documentation

## 2021-11-19 HISTORY — DX: Elevated blood-pressure reading, without diagnosis of hypertension: R03.0

## 2021-11-19 LAB — TSH: TSH: 2.21 u[IU]/mL (ref 0.35–5.50)

## 2021-11-19 LAB — T4, FREE: Free T4: 0.82 ng/dL (ref 0.60–1.60)

## 2021-11-19 MED ORDER — AMLODIPINE BESYLATE 2.5 MG PO TABS: 2.5000 mg | ORAL_TABLET | Freq: Every day | ORAL | 3 refills | Status: DC

## 2021-11-19 MED ORDER — SHINGRIX 50 MCG/0.5ML IM SUSR: 0.5000 mL | Freq: Once | INTRAMUSCULAR | 0 refills | Status: AC

## 2021-11-19 NOTE — Progress Notes (Signed)
Chief Complaint  ?Patient presents with  ? Establish Care  ?  Was part of research study for Alzheimer's and during study BP was elevated. She would also like to discuss about wt gain in the past yr as well as being cold all the time. Labs with [facility] in the past couple months unable to see in care everywhere.  ? ?Establish care  ?1. Htn was in study at [facility] for dementia but bp elevated during study during the past year and also problem with wt gain due to left knee pain and unable to exercise she does use salt on her food will try to cut back  ?2. Left knee pain had gel injections [facility] pending MRI left knee ? ?Review of Systems  ?Constitutional:  Negative for weight loss.  ?HENT:  Negative for hearing loss.   ?Eyes:  Negative for blurred vision.  ?Respiratory:  Negative for shortness of breath.   ?Cardiovascular:  Negative for chest pain.  ?Gastrointestinal:  Negative for abdominal pain and blood in stool.  ?Genitourinary:  Negative for dysuria.  ?Musculoskeletal:  Positive for joint pain. Negative for falls.  ?Skin:  Negative for rash.  ?Neurological:  Negative for headaches.  ?Psychiatric/Behavioral:  Negative for depression.   ?Past Medical History:  ?Diagnosis Date  ? Actinic keratosis 03/20/2020  ? right distal calf  ? Anemia   ? when younger  ? Arthritis   ? left foot and leg  ? Basal cell carcinoma 03/23/2018  ? left medial inf shin/superficial  ? Basal cell carcinoma 03/20/2020  ? left medial inf shin  ? Benign neoplasm of ascending colon   ? Benign neoplasm of sigmoid colon   ? Complication of anesthesia   ? difficult waking up  ? Compression fracture of C-spine ([place])   ? C4-C5 - 30 yrs ago - no limitations or issues  ? GERD (gastroesophageal reflux disease)   ? not recently  ? Glaucoma   ? Hypothyroidism   ? in past - no meds or issues over 5 yrs  ? Mitral valve prolapse   ? followed by PCP  ? Mitral valve prolapse   ? no issues  ? Scoliosis   ? mild - lower back  ? Special screening for  malignant neoplasms, colon   ? Wears contact lenses   ? ?Past Surgical History:  ?Procedure Laterality Date  ? CESAREAN SECTION    ? x 2  ? COLONOSCOPY    ? COLONOSCOPY WITH PROPOFOL N/A 10/21/2015  ? Procedure: COLONOSCOPY WITH PROPOFOL;  Surgeon: Lucilla Lame, MD;  Location: [facility];  Service: Endoscopy;  Laterality: N/A;  ? COLONOSCOPY WITH PROPOFOL N/A 03/31/2019  ? Procedure: COLONOSCOPY WITH BIOPSY;  Surgeon: Lucilla Lame, MD;  Location: [facility];  Service: Endoscopy;  Laterality: N/A;  ? COLPOSCOPY    ? FOOT SURGERY Left   ? MANDIBLE SURGERY    ? POLYPECTOMY  10/21/2015  ? Procedure: POLYPECTOMY;  Surgeon: Lucilla Lame, MD;  Location: [facility];  Service: Endoscopy;;  ? POLYPECTOMY N/A 03/31/2019  ? Procedure: POLYPECTOMY;  Surgeon: Lucilla Lame, MD;  Location: [facility];  Service: Endoscopy;  Laterality: N/A;  Clip x1 placed in Sigmoid Colon  ? ?Family History  ?Problem Relation Age of Onset  ? Hypertension Mother   ? COPD Mother   ? Stroke Mother   ?     in 2s  ? Dementia Sister   ?     died 16  ? Hypertension Sister   ?  Cancer Maternal Grandmother   ? Heart disease Maternal Grandfather   ? Hypertension Maternal Grandfather   ? Diabetes Paternal Grandmother   ? Heart disease Paternal Grandmother   ? Stroke Paternal Grandmother   ? Heart disease Paternal Grandfather   ? Hypertension Paternal Grandfather   ? Breast cancer Other   ?     1 61 y.o breast cancer, 61 y.o breast cancer 2 nieces different types they are cousins  ? ?Social History  ? ?Socioeconomic History  ? Marital status: Divorced  ?  Spouse name: Not on file  ? Number of children: Not on file  ? Years of education: Not on file  ? Highest education level: Not on file  ?Occupational History  ? Not on file  ?Tobacco Use  ? Smoking status: Former  ?  Packs/day: 0.25  ?  Years: 40.00  ?  Pack years: 10.00  ?  Types: Cigarettes  ?  Quit date: 04/14/2015  ?  Years since quitting: 6.6  ? Smokeless tobacco: Never  ?  Tobacco comments:  ?  1/2 PPWeek, off and on for 35 yrs  ?Vaping Use  ? Vaping Use: Never used  ?Substance and Sexual Activity  ? Alcohol use: Yes  ?  Comment: 1 Bottle of Wine Weekly  ? Drug use: No  ? Sexual activity: Yes  ?Other Topics Concern  ? Not on file  ?Social History Narrative  ? 2 kids 1 son 19, 1 daughter 64   ? [place], [place] house, reading, exercise  ? 10 month grandsons  ? 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 Lived in [place]   ? Works at Music therapist office   ? Separated in 2017 divorced 2019   ? ?Social Determinants of Health  ? ?Financial Resource Strain: Not on file  ?Food Insecurity: Not on file  ?Transportation Needs: Not on file  ?Physical Activity: Not on file  ?Stress: Not on file  ?Social Connections: Not on file  ?Intimate Partner Violence: Not on file  ? ?Current Meds  ?Medication Sig  ? amLODipine (NORVASC) 2.5 MG tablet Take 1 tablet (2.5 mg total) by mouth daily.  ? latanoprost (XALATAN) 0.005 % ophthalmic solution Place 1 drop into both eyes at bedtime.  ? Zoster Vaccine Adjuvanted [facility]) injection Inject 0.5 mLs into the muscle once for 1 dose. X 2 doses  ? ?Allergies  ?Allergen Reactions  ? Hydrocodone Nausea And Vomiting  ? Penicillins Hives and Itching  ? Sulfa Antibiotics Nausea And Vomiting  ? Erythromycin   ?  Other reaction(s): GI Intolerance  ? ?No results found for this or any previous visit (from the past 2160 hour(s)). ?Objective  ?Body mass index is 28.42 kg/m?. ?Wt Readings from Last 3 Encounters:  ?11/19/21 150 lb 6.4 oz (68.2 kg)  ?03/31/19 131 lb (59.4 kg)  ?08/23/17 127 lb (57.6 kg)  ? ?Temp Readings from Last 3 Encounters:  ?11/19/21 98 ?F (36.7 ?C) (Oral)  ?03/31/19 (!) 97 ?F (36.1 ?C)  ?04/13/17 98 ?F (36.7 ?C) (Oral)  ? ?BP Readings from Last 3 Encounters:  ?11/19/21 130/88  ?03/31/19 120/77  ?08/23/17 120/80  ? ?Pulse Readings from Last 3 Encounters:  ?11/19/21 67  ?03/31/19 (!) 58  ?08/23/17 60   ? ? ?Physical Exam ?Vitals and nursing note reviewed.  ?Constitutional:   ?   Appearance: Normal appearance. She is well-developed and well-groomed.  ?HENT:  ?   Head: Normocephalic and atraumatic.  ?Eyes:  ?  Conjunctiva/sclera: Conjunctivae normal.  ?   Pupils: Pupils are equal, round, and reactive to light.  ?Cardiovascular:  ?   Rate and Rhythm: Normal rate and regular rhythm.  ?   Heart sounds: Normal heart sounds. No murmur heard. ?Pulmonary:  ?   Effort: Pulmonary effort is normal.  ?   Breath sounds: Normal breath sounds.  ?Abdominal:  ?   General: Abdomen is flat. Bowel sounds are normal.  ?   Tenderness: There is no abdominal tenderness.  ?Musculoskeletal:     ?   General: No tenderness.  ?Skin: ?   General: Skin is warm and dry.  ?Neurological:  ?   General: No focal deficit present.  ?   Mental Status: She is alert and oriented to person, place, and time. Mental status is at baseline.  ?   Cranial Nerves: Cranial nerves 2-12 are intact.  ?   Motor: Motor function is intact.  ?   Coordination: Coordination is intact.  ?   Gait: Gait is intact.  ?Psychiatric:     ?   Attention and Perception: Attention and perception normal.     ?   Mood and Affect: Mood and affect normal.     ?   Speech: Speech normal.     ?   Behavior: Behavior normal. Behavior is cooperative.     ?   Thought Content: Thought content normal.     ?   Cognition and Memory: Cognition and memory normal.     ?   Judgment: Judgment normal.  ? ? ?Assessment  ?Plan  ?Elevated blood pressure reading - Plan: amLODipine (NORVASC) 2.5 MG tablet ?Pt will try to work on [place] if needed start norvasc 2.5 mg qd  ? ?Need for shingles vaccine - Plan: Zoster Vaccine Adjuvanted [facility]) injection ? ?Hypothyroidism, unspecified type - Plan: TSH, T4, free ?Was on synthryoid in the past ?Cold intolerance - Plan: TSH, T4, free ? ?Chronic pain of left knee  ?F/u [facility]  ? ?Hair loss  ?Consider collagen and skin hair and nail vitamin  ? ?HM ?Flu  shot utd fall 2022  ?Tdap 04/13/17  ?Covid total 4 shots ? type last booster fall 2022  ?Shingrix vaccine discuss today givne Rx  ? ?Colonoscopy due 03/2024 Dr. Allen Norris FH + sister colectomy  ?Mammogram 10/06/21  ?Ob/gyn

## 2021-11-19 NOTE — Patient Instructions (Addendum)
Topical voltarel gel 4x per day or Aspercream with lidocaine  ? ?Consider omron upper arm blood pressure cuff  ? ?How to Take Your Blood Pressure ?Blood pressure is a measurement of how strongly your blood is pressing against the walls of your arteries. Arteries are blood vessels that carry blood from your heart throughout your body. Your health care provider takes your blood pressure at each office visit. You can also take your own blood pressure at home with a blood pressure monitor. ?You may need to take your own blood pressure to: ?Confirm a diagnosis of high blood pressure (hypertension). ?Monitor your blood pressure over time. ?Make sure your blood pressure medicine is working. ?Supplies needed: ?Blood pressure monitor. ?Dining room chair to sit in. ?Table or desk. ?Small notebook and pencil or pen. ?How to prepare ?To get the most accurate reading, avoid the following for 30 minutes before you check your blood pressure: ?Drinking caffeine. ?Drinking alcohol. ?Eating. ?Smoking. ?Exercising. ?Five minutes before you check your blood pressure: ?Use the bathroom and urinate so that you have an empty bladder. ?Sit quietly in a dining room chair. Do not sit in a soft couch or an armchair. Do not talk. ?How to take your blood pressure ?To check your blood pressure, follow the instructions in the manual that came with your blood pressure monitor. If you have a digital blood pressure monitor, the instructions may be as follows: ?Sit up straight in a chair. ?Place your feet on the floor. Do not cross your ankles or legs. ?Rest your left arm at the level of your heart on a table or desk or on the arm of a chair. ?Pull up your shirt sleeve. ?Wrap the blood pressure cuff around the upper part of your left arm, 1 inch (2.5 cm) above your elbow. It is best to wrap the cuff around bare skin. ?Fit the cuff snugly around your arm. You should be able to place only one finger between the cuff and your arm. ?Position the cord so  that it rests in the bend of your elbow. ?Press the power button. ?Sit quietly while the cuff inflates and deflates. ?Read the digital reading on the monitor screen and write the numbers down (record them) in a notebook. ?Wait 2-3 minutes, then repeat the steps, starting at step 1. ?What does my blood pressure reading mean? ?A blood pressure reading consists of a higher number over a lower number. Ideally, your blood pressure should be below 120/80. The first ("top") number is called the systolic pressure. It is a measure of the pressure in your arteries as your heart beats. The second ("bottom") number is called the diastolic pressure. It is a measure of the pressure in your arteries as the heart relaxes. ?Blood pressure is classified into five stages. The following are the stages for adults who do not have a short-term serious illness or a chronic condition. Systolic pressure and diastolic pressure are measured in a unit called mm Hg (millimeters of mercury).  ?Normal ?Systolic pressure: below 993. ?Diastolic pressure: below 80. ?Elevated ?Systolic pressure: 716-967. ?Diastolic pressure: below 80. ?Hypertension stage 1 ?Systolic pressure: 893-810. ?Diastolic pressure: 17-51. ?Hypertension stage 2 ?Systolic pressure: 025 or above. ?Diastolic pressure: 90 or above. ?You can have elevated blood pressure or hypertension even if only the systolic or only the diastolic number in your reading is higher than normal. ?Follow these instructions at home: ?Medicines ?Take over-the-counter and prescription medicines only as told by your health care provider. ?Tell your health care  provider if you are having any side effects from blood pressure medicine. ?General instructions ?Check your blood pressure as often as recommended by your health care provider. ?Check your blood pressure at the same time every day. ?Take your monitor to the next appointment with your health care provider to make sure that: ?You are using it  correctly. ?It provides accurate readings. ?Understand what your goal blood pressure numbers are. ?Keep all follow-up visits as told by your health care provider. This is important. ?General tips ?Your health care provider can suggest a reliable monitor that will meet your needs. There are several types of home blood pressure monitors. ?Choose a monitor that has an arm cuff. Do not choose a monitor that measures your blood pressure from your wrist or finger. ?Choose a cuff that wraps snugly around your upper arm. You should be able to fit only one finger between your arm and the cuff. ?You can buy a blood pressure monitor at most drugstores or online. ?Where to find more information ?American Heart Association: www.heart.org ?Contact a health care provider if: ?Your blood pressure is consistently high. ?Your blood pressure is suddenly low. ?Get help right away if: ?Your systolic blood pressure is higher than 180. ?Your diastolic blood pressure is higher than 120. ?Summary ?Blood pressure is a measurement of how strongly your blood is pressing against the walls of your arteries. ?A blood pressure reading consists of a higher number over a lower number. Ideally, your blood pressure should be below 120/80. ?Check your blood pressure at the same time every day. ?Avoid caffeine, alcohol, smoking, and exercise for 30 minutes prior to checking your blood pressure. These agents can affect the accuracy of the blood pressure reading. ?This information is not intended to replace advice given to you by your health care provider. Make sure you discuss any questions you have with your health care provider. ?Document Revised: 06/05/2020 Document Reviewed: 07/21/2019 ?Elsevier Patient Education ? [place] Eating Plan ?DASH stands for Dietary Approaches to Stop Hypertension. The DASH eating plan is a healthy eating plan that has been shown to: ?Reduce high blood pressure (hypertension). ?Reduce your risk for type 2  diabetes, heart disease, and stroke. ?Help with weight loss. ?What are tips for following this plan? ?Reading food labels ?Check food labels for the amount of salt (sodium) per serving. Choose foods with less than 5 percent of the Daily Value of sodium. Generally, foods with less than 300 milligrams (mg) of sodium per serving fit into this eating plan. ?To find whole grains, look for the word "whole" as the first word in the ingredient list. ?Shopping ?Buy products labeled as "low-sodium" or "no salt added." ?Buy fresh foods. Avoid canned foods and pre-made or frozen meals. ?Cooking ?Avoid adding salt when cooking. Use salt-free seasonings or herbs instead of table salt or sea salt. Check with your health care provider or pharmacist before using salt substitutes. ?Do not fry foods. Cook foods using healthy methods such as baking, boiling, grilling, roasting, and broiling instead. ?Cook with heart-healthy oils, such as olive, canola, avocado, soybean, or sunflower oil. ?Meal planning ? ?Eat a balanced diet that includes: ?4 or more servings of fruits and 4 or more servings of vegetables each day. Try to fill one-half of your plate with fruits and vegetables. ?6-8 servings of whole grains each day. ?Less than 6 oz (170 g) of lean meat, poultry, or fish each day. A 3-oz (85-g) serving of meat is about the same size  as a deck of cards. One egg equals 1 oz (28 g). ?2-3 servings of low-fat dairy each day. One serving is 1 cup (237 mL). ?1 serving of nuts, seeds, or beans 5 times each week. ?2-3 servings of heart-healthy fats. Healthy fats called omega-3 fatty acids are found in foods such as walnuts, flaxseeds, fortified milks, and eggs. These fats are also found in cold-water fish, such as sardines, salmon, and mackerel. ?Limit how much you eat of: ?Canned or prepackaged foods. ?Food that is high in trans fat, such as some fried foods. ?Food that is high in saturated fat, such as fatty meat. ?Desserts and other sweets,  sugary drinks, and other foods with added sugar. ?Full-fat dairy products. ?Do not salt foods before eating. ?Do not eat more than 4 egg yolks a week. ?Try to eat at least 2 vegetarian meals a week. ?Eat more

## 2021-11-26 ENCOUNTER — Encounter: Payer: Self-pay | Admitting: Internal Medicine

## 2022-03-31 ENCOUNTER — Ambulatory Visit: Payer: BC Managed Care – PPO | Admitting: Internal Medicine

## 2022-04-09 ENCOUNTER — Encounter: Payer: Self-pay | Admitting: Internal Medicine

## 2022-04-09 ENCOUNTER — Ambulatory Visit: Payer: BC Managed Care – PPO | Admitting: Internal Medicine

## 2022-04-09 VITALS — BP 122/74 | HR 63 | Temp 98.1°F | Ht 61.0 in | Wt 147.8 lb

## 2022-04-09 DIAGNOSIS — M25562 Pain in left knee: Secondary | ICD-10-CM

## 2022-04-09 DIAGNOSIS — Z23 Encounter for immunization: Secondary | ICD-10-CM | POA: Diagnosis not present

## 2022-04-09 DIAGNOSIS — Z0000 Encounter for general adult medical examination without abnormal findings: Secondary | ICD-10-CM

## 2022-04-09 DIAGNOSIS — E785 Hyperlipidemia, unspecified: Secondary | ICD-10-CM

## 2022-04-09 DIAGNOSIS — Z13220 Encounter for screening for lipoid disorders: Secondary | ICD-10-CM | POA: Diagnosis not present

## 2022-04-09 DIAGNOSIS — Z01411 Encounter for gynecological examination (general) (routine) with abnormal findings: Secondary | ICD-10-CM

## 2022-04-09 DIAGNOSIS — E559 Vitamin D deficiency, unspecified: Secondary | ICD-10-CM

## 2022-04-09 DIAGNOSIS — Z01419 Encounter for gynecological examination (general) (routine) without abnormal findings: Secondary | ICD-10-CM | POA: Diagnosis not present

## 2022-04-09 DIAGNOSIS — G8929 Other chronic pain: Secondary | ICD-10-CM

## 2022-04-09 DIAGNOSIS — Z1329 Encounter for screening for other suspected endocrine disorder: Secondary | ICD-10-CM

## 2022-04-09 DIAGNOSIS — Z1389 Encounter for screening for other disorder: Secondary | ICD-10-CM

## 2022-04-09 DIAGNOSIS — Z1231 Encounter for screening mammogram for malignant neoplasm of breast: Secondary | ICD-10-CM

## 2022-04-09 MED ORDER — SHINGRIX 50 MCG/0.5ML IM SUSR: 0.5000 mL | Freq: Once | INTRAMUSCULAR | 1 refills | Status: AC

## 2022-04-09 NOTE — Progress Notes (Signed)
Chief Complaint  Patient presents with   Follow-up    4 month f/u   Annual  Doing well  Htn did not get norvasc 2.5 mg qd and monitoring BP at home and at goal <130/<80 watching salt intake  Left knee pain doing better in [facility] in [place] and had rooster shots wearing sneakers more and helping     Review of Systems  Constitutional:  Negative for weight loss.  HENT:  Negative for hearing loss.   Eyes:  Negative for blurred vision.  Respiratory:  Negative for shortness of breath.   Cardiovascular:  Negative for chest pain.  Gastrointestinal:  Negative for abdominal pain and blood in stool.  Genitourinary:  Negative for dysuria.  Musculoskeletal:  Negative for falls and joint pain.  Skin:  Negative for rash.  Neurological:  Negative for headaches.  Psychiatric/Behavioral:  Negative for depression.    Past Medical History:  Diagnosis Date   Actinic keratosis 03/20/2020   right distal calf   Anemia    when younger   Arthritis    left foot and leg   ASCUS with positive high risk HPV cervical    h/i ca in situ 01/20/06, ascus 12/29/17, lsil 7/2/1, endometrial ablation 10/27/07   Basal cell carcinoma 03/23/2018   left medial inf shin/superficial   Basal cell carcinoma 03/20/2020   left medial inf shin   Benign neoplasm of ascending colon    Benign neoplasm of sigmoid colon    Complication of anesthesia    difficult waking up   Compression fracture of C-spine (HCC)    C4-C5 - 30 yrs ago - no limitations or issues   GERD (gastroesophageal reflux disease)    not recently   Glaucoma    Hypothyroidism    in past - no meds or issues over 5 yrs   Left knee pain    03/2022 in [facility] Dr. Theda Sers did [place] shot + arthritis   Mitral valve prolapse    followed by PCP   Mitral valve prolapse    no issues   Scoliosis    mild - lower back   Special screening for malignant neoplasms, colon    Wears contact lenses    Past Surgical History:  Procedure Laterality Date    CESAREAN SECTION     x 2   COLONOSCOPY     COLONOSCOPY WITH PROPOFOL N/A 10/21/2015   Procedure: COLONOSCOPY WITH PROPOFOL;  Surgeon: Lucilla Lame, MD;  Location: [facility];  Service: Endoscopy;  Laterality: N/A;   COLONOSCOPY WITH PROPOFOL N/A 03/31/2019   Procedure: COLONOSCOPY WITH BIOPSY;  Surgeon: Lucilla Lame, MD;  Location: [facility];  Service: Endoscopy;  Laterality: N/A;   COLPOSCOPY     FOOT SURGERY Left    MANDIBLE SURGERY     POLYPECTOMY  10/21/2015   Procedure: POLYPECTOMY;  Surgeon: Lucilla Lame, MD;  Location: [facility];  Service: Endoscopy;;   POLYPECTOMY N/A 03/31/2019   Procedure: POLYPECTOMY;  Surgeon: Lucilla Lame, MD;  Location: [facility];  Service: Endoscopy;  Laterality: N/A;  Clip x1 placed in Sigmoid Colon   Family History  Problem Relation Age of Onset   Hypertension Mother    COPD Mother    Stroke Mother        in 61s   Dementia Sister        died 25   Hypertension Sister    Cancer Maternal Grandmother    Heart disease Maternal Grandfather  Hypertension Maternal Grandfather    Diabetes Paternal Grandmother    Heart disease Paternal Grandmother    Stroke Paternal Grandmother    Heart disease Paternal Grandfather    Hypertension Paternal Grandfather    Breast cancer Other        1 61 y.o breast cancer, 61 y.o breast cancer 2 nieces different types they are cousins   Social History   Socioeconomic History   Marital status: Divorced    Spouse name: Not on file   Number of children: Not on file   Years of education: Not on file   Highest education level: Not on file  Occupational History   Not on file  Tobacco Use   Smoking status: Former    Packs/day: 0.25    Years: 40.00    Total pack years: 10.00    Types: Cigarettes    Quit date: 04/14/2015    Years since quitting: 6.9   Smokeless tobacco: Never   Tobacco comments:    1/2 PPWeek, off and on for 35 yrs  Vaping Use   Vaping Use: Never used   Substance and Sexual Activity   Alcohol use: Yes    Comment: 1 Bottle of Wine Weekly   Drug use: No   Sexual activity: Yes  Other Topics Concern   Not on file  Social History Narrative   2 kids 1 son 98, 1 daughter 61    [place], beach house, reading, exercise   10 month grandsons   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   Zoster Vaccine Adjuvanted [facility]) injection Inject 0.5 mLs into the muscle once for 1 dose.   Allergies  Allergen Reactions   Hydrocodone Nausea And Vomiting   Penicillins Hives and Itching   Sulfa Antibiotics Nausea And Vomiting   Erythromycin     Other reaction(s): GI Intolerance   No results found for this or any previous visit (from the past 2160 hour(s)). Objective  Body mass index is 27.93 kg/m. Wt Readings from Last 3 Encounters:  04/09/22 147 lb 12.8 oz (67 kg)  11/19/21 150 lb 6.4 oz (68.2 kg)  03/31/19 131 lb (59.4 kg)   Temp Readings from Last 3 Encounters:  04/09/22 98.1 F (36.7 C) (Oral)  11/19/21 98 F (36.7 C) (Oral)  03/31/19 (!) 97 F (36.1 C)   BP Readings from Last 3 Encounters:  04/09/22 122/74  11/19/21 130/88  03/31/19 120/77   Pulse Readings from Last 3 Encounters:  04/09/22 63  11/19/21 67  03/31/19 (!) 58    Physical Exam Vitals and nursing note reviewed.  Constitutional:      Appearance: Normal appearance. She is well-developed and well-groomed.  HENT:     Head:  Normocephalic and atraumatic.  Eyes:     Conjunctiva/sclera: Conjunctivae normal.     Pupils: Pupils are equal, round, and reactive to light.  Cardiovascular:     Rate and Rhythm:  Normal rate and regular rhythm.     Heart sounds: Normal heart sounds. No murmur heard. Pulmonary:     Effort: Pulmonary effort is normal.     Breath sounds: Normal breath sounds.  Abdominal:     General: Abdomen is flat. Bowel sounds are normal.     Tenderness: There is no abdominal tenderness.  Musculoskeletal:        General: No tenderness.  Skin:    General: Skin is warm and dry.  Neurological:     General: No focal deficit present.     Mental Status: She is alert and oriented to person, place, and time. Mental status is at baseline.     Cranial Nerves: Cranial nerves 2-12 are intact.     Motor: Motor function is intact.     Coordination: Coordination is intact.     Gait: Gait is intact.  Psychiatric:        Attention and Perception: Attention and perception normal.        Mood and Affect: Mood and affect normal.        Speech: Speech normal.        Behavior: Behavior normal. Behavior is cooperative.        Thought Content: Thought content normal.        Cognition and Memory: Cognition and memory normal.        Judgment: Judgment normal.     Assessment  Plan  Annual physical exam - Plan: Comprehensive metabolic panel, Lipid panel, CBC with Differential/Platelet, TSH, Urinalysis, Routine w reflex microscopic, Vitamin D (25 hydroxy) Labs spring 2024  See below  Chronic pain of left knee F/u [facility] [place] Dr. Theda Sers s/p rooster inj   HM Flu shot will get at pharmacy with covid shots  Tdap 04/13/17  Covid total 4 shots ? type last booster fall 2022  Shingrix vaccine  given Rx   Hep c declines   Colonoscopy due 03/2024 Dr. Allen Norris FH + sister colectomy  Mammogram 10/06/21 FH niece x 2 39 and 49 breast cancer (nieces are cousins) Ob/gyn Dr. Cherylann Banas pap ROI today with labs referred to [facility] in [place]  due 09/2022   Dexa 04/20/18  Skin Dr. [place]rence Ferrari 04/2022   [facility] left knee MRI sch 11/2021 tried gel inj 10/2021 Dr. Bernerd Limbo eye in [facility] ocular htn   Of note was on wellbutrin in the past couldn't tell difference  Provider: Dr. Olivia Mackie [name]-Internal Medicine

## 2022-04-09 NOTE — Patient Instructions (Addendum)
Dr. Linda Hedges Obstetrician-gynecologist in [place], [place] Address: [address], [place], [place] [address] Phone: [phone]  Consider covid shot booster fall 2023  Consider flu shot and shingrix vaccine   F/u Dr. Volanda Napoleon new PCP  I need pap smear and any labs sent to my chart please    Zoster Vaccine, Recombinant injection What is this medication? ZOSTER VACCINE (ZOS ter vak SEEN) is a vaccine used to reduce the risk of getting shingles. This vaccine is not used to treat shingles or nerve pain from shingles. This medicine may be used for other purposes; ask your health care provider or pharmacist if you have questions. COMMON BRAND NAME(S): [facility] What should I tell my care team before I take this medication? They need to know if you have any of these conditions: cancer immune system problems an unusual or allergic reaction to Zoster vaccine, other medications, foods, dyes, or preservatives pregnant or trying to get pregnant breast-feeding How should I use this medication? This vaccine is injected into a muscle. It is given by a health care provider. A copy of Vaccine Information Statements will be given before each vaccination. Be sure to read this information carefully each time. This sheet may change often. Talk to your health care provider about the use of this vaccine in children. This vaccine is not approved for use in children. Overdosage: If you think you have taken too much of this medicine contact a poison control center or emergency room at once. NOTE: This medicine is only for you. Do not share this medicine with others. What if I miss a dose? Keep appointments for follow-up (booster) doses. It is important not to miss your dose. Call your health care provider if you are unable to keep an appointment. What may interact with this medication? medicines that suppress your immune system medicines to treat cancer steroid medicines like prednisone  or cortisone This list may not describe all possible interactions. Give your health care provider a list of all the medicines, herbs, non-prescription drugs, or dietary supplements you use. Also tell them if you smoke, drink alcohol, or use illegal drugs. Some items may interact with your medicine. What should I watch for while using this medication? Visit your health care provider regularly. This vaccine, like all vaccines, may not fully protect everyone. What side effects may I notice from receiving this medication? Side effects that you should report to your doctor or health care professional as soon as possible: allergic reactions (skin rash, itching or hives; swelling of the face, lips, or tongue) trouble breathing Side effects that usually do not require medical attention (report these to your doctor or health care professional if they continue or are bothersome): chills headache fever nausea pain, redness, or irritation at site where injected tiredness vomiting This list may not describe all possible side effects. Call your doctor for medical advice about side effects. You may report side effects to FDA at 1-800-FDA-1088. Where should I keep my medication? This vaccine is only given by a health care provider. It will not be stored at home. NOTE: This sheet is a summary. It may not cover all possible information. If you have questions about this medicine, talk to your doctor, pharmacist, or health care provider.  2023 Elsevier/Gold Standard (2021-06-27 00:00:00)

## 2022-04-13 ENCOUNTER — Encounter: Payer: Self-pay | Admitting: Internal Medicine

## 2022-04-16 ENCOUNTER — Ambulatory Visit: Payer: BC Managed Care – PPO | Admitting: Dermatology

## 2022-04-16 DIAGNOSIS — L821 Other seborrheic keratosis: Secondary | ICD-10-CM

## 2022-04-16 DIAGNOSIS — L111 Transient acantholytic dermatosis [Grover]: Secondary | ICD-10-CM

## 2022-04-16 DIAGNOSIS — D1800 Hemangioma unspecified site: Secondary | ICD-10-CM

## 2022-04-16 DIAGNOSIS — B079 Viral wart, unspecified: Secondary | ICD-10-CM

## 2022-04-16 DIAGNOSIS — D229 Melanocytic nevi, unspecified: Secondary | ICD-10-CM

## 2022-04-16 DIAGNOSIS — L814 Other melanin hyperpigmentation: Secondary | ICD-10-CM | POA: Diagnosis not present

## 2022-04-16 DIAGNOSIS — Z1283 Encounter for screening for malignant neoplasm of skin: Secondary | ICD-10-CM | POA: Diagnosis not present

## 2022-04-16 DIAGNOSIS — L578 Other skin changes due to chronic exposure to nonionizing radiation: Secondary | ICD-10-CM | POA: Diagnosis not present

## 2022-04-16 NOTE — Patient Instructions (Addendum)
Cryotherapy Aftercare  Wash gently with soap and water everyday.   Apply Vaseline and Band-Aid daily until healed.   Recommend taking Heliocare sun protection supplement daily in sunny weather for additional sun protection. For maximum protection on the sunniest days, you can take up to 2 capsules of regular Heliocare OR take 1 capsule of Heliocare Ultra. For prolonged exposure (such as a full day in the sun), you can repeat your dose of the supplement 4 hours after your first dose. Heliocare can be purchased at [facility], at some [facility] or at www.heliocare.com.    Melanoma ABCDEs  Melanoma is the most dangerous type of skin cancer, and is the leading cause of death from skin disease.  You are more likely to develop melanoma if you: Have light-colored skin, light-colored eyes, or red or blond hair Spend a lot of time in the sun Tan regularly, either outdoors or in a tanning bed Have had blistering sunburns, especially during childhood Have a close family member who has had a melanoma Have atypical moles or large birthmarks  Early detection of melanoma is key since treatment is typically straightforward and cure rates are extremely high if we catch it early.   The first sign of melanoma is often a change in a mole or a new dark spot.  The ABCDE system is a way of remembering the signs of melanoma.  A for asymmetry:  The two halves do not match. B for border:  The edges of the growth are irregular. C for color:  A mixture of colors are present instead of an even brown color. D for diameter:  Melanomas are usually (but not always) greater than 6mm - the size of a pencil eraser. E for evolution:  The spot keeps changing in size, shape, and color.  Please check your skin once per month between visits. You can use a small mirror in front and a large mirror behind you to keep an eye on the back side or your body.   If you see any new or changing lesions before your next follow-up,  please call to schedule a visit.  Please continue daily skin protection including broad spectrum sunscreen SPF 30+ to sun-exposed areas, reapplying every 2 hours as needed when you're outdoors.    Due to recent changes in healthcare laws, you may see results of your pathology and/or laboratory studies on MyChart before the doctors have had a chance to review them. We understand that in some cases there may be results that are confusing or concerning to you. Please understand that not all results are received at the same time and often the doctors may need to interpret multiple results in order to provide you with the best plan of care or course of treatment. Therefore, we ask that you please give us 2 business days to thoroughly review all your results before contacting the office for clarification. Should we see a critical lab result, you will be contacted sooner.   If You Need Anything After Your Visit  If you have any questions or concerns for your doctor, please call our main line at [phone] and press option 4 to reach your doctor's medical assistant. If no one answers, please leave a voicemail as directed and we will return your call as soon as possible. Messages left after 4 pm will be answered the following business day.   You may also send us a message via MyChart. We typically respond to MyChart messages within 1-2 business days.    For prescription refills, please ask your pharmacy to contact our office. Our fax number is [phone].  If you have an urgent issue when the clinic is closed that cannot wait until the next business day, you can page your doctor at the number below.    Please note that while we do our best to be available for urgent issues outside of office hours, we are not available 24/7.   If you have an urgent issue and are unable to reach us, you may choose to seek medical care at your doctor's office, retail clinic, urgent care center, or emergency room.  If you  have a medical emergency, please immediately call 911 or go to the emergency department.  Pager Numbers  - Dr. Kowalski: [phone]  - Dr. Moye: [phone]  - Dr. Stewart: [phone]  In the event of inclement weather, please call our main line at [phone] for an update on the status of any delays or closures.  Dermatology Medication Tips: Please keep the boxes that topical medications come in in order to help keep track of the instructions about where and how to use these. Pharmacies typically print the medication instructions only on the boxes and not directly on the medication tubes.   If your medication is too expensive, please contact our office at [phone] option 4 or send us a message through MyChart.   We are unable to tell what your co-pay for medications will be in advance as this is different depending on your insurance coverage. However, we may be able to find a substitute medication at lower cost or fill out paperwork to get insurance to cover a needed medication.   If a prior authorization is required to get your medication covered by your insurance company, please allow us 1-2 business days to complete this process.  Drug prices often vary depending on where the prescription is filled and some pharmacies may offer cheaper prices.  The website www.goodrx.com contains coupons for medications through different pharmacies. The prices here do not account for what the cost may be with help from insurance (it may be cheaper with your insurance), but the website can give you the price if you did not use any insurance.  - You can print the associated coupon and take it with your prescription to the pharmacy.  - You may also stop by our office during regular business hours and pick up a GoodRx coupon card.  - If you need your prescription sent electronically to a different pharmacy, notify our office through [facility] MyChart or by phone at [phone] option  4.     Si Usted Necesita Algo Despus de Su Visita  Tambin puede enviarnos un mensaje a travs de MyChart. Por lo general respondemos a los mensajes de MyChart en el transcurso de 1 a 2 das hbiles.  Para renovar recetas, por favor pida a su farmacia que se ponga en contacto con nuestra oficina. Nuestro nmero de fax es el [phone].  Si tiene un asunto urgente cuando la clnica est cerrada y que no puede esperar hasta el siguiente da hbil, puede llamar/localizar a su doctor(a) al nmero que aparece a continuacin.   Por favor, tenga en cuenta que aunque hacemos todo lo posible para estar disponibles para asuntos urgentes fuera del horario de oficina, no estamos disponibles las 24 horas del da, los 7 das de la semana.   Si tiene un problema urgente y no puede comunicarse con nosotros, puede optar por buscar atencin mdica  en   el consultorio de su doctor(a), en una clnica privada, en un centro de atencin urgente o en una sala de emergencias.  Si tiene una emergencia mdica, por favor llame inmediatamente al 911 o vaya a la sala de emergencias.  Nmeros de bper  - Dr. Kowalski: [phone]  - Dra. Moye: [phone]  - Dra. Stewart: [phone]  En caso de inclemencias del tiempo, por favor llame a nuestra lnea principal al [phone] para una actualizacin sobre el estado de cualquier retraso o cierre.  Consejos para la medicacin en dermatologa: Por favor, guarde las cajas en las que vienen los medicamentos de uso tpico para ayudarle a seguir las instrucciones sobre dnde y cmo usarlos. Las farmacias generalmente imprimen las instrucciones del medicamento slo en las cajas y no directamente en los tubos del medicamento.   Si su medicamento es muy caro, por favor, pngase en contacto con nuestra oficina llamando al [phone] y presione la opcin 4 o envenos un mensaje a travs de MyChart.   No podemos decirle cul ser su copago por los medicamentos por  adelantado ya que esto es diferente dependiendo de la cobertura de su seguro. Sin embargo, es posible que podamos encontrar un medicamento sustituto a menor costo o llenar un formulario para que el seguro cubra el medicamento que se considera necesario.   Si se requiere una autorizacin previa para que su compaa de seguros cubra su medicamento, por favor permtanos de 1 a 2 das hbiles para completar este proceso.  Los precios de los medicamentos varan con frecuencia dependiendo del lugar de dnde se surte la receta y alguna farmacias pueden ofrecer precios ms baratos.  El sitio web www.goodrx.com tiene cupones para medicamentos de diferentes farmacias. Los precios aqu no tienen en cuenta lo que podra costar con la ayuda del seguro (puede ser ms barato con su seguro), pero el sitio web puede darle el precio si no utiliz ningn seguro.  - Puede imprimir el cupn correspondiente y llevarlo con su receta a la farmacia.  - Tambin puede pasar por nuestra oficina durante el horario de atencin regular y recoger una tarjeta de cupones de GoodRx.  - Si necesita que su receta se enve electrnicamente a una farmacia diferente, informe a nuestra oficina a travs de MyChart de [place] o por telfono llamando al [phone] y presione la opcin 4.  

## 2022-04-16 NOTE — Progress Notes (Signed)
Follow-Up Visit   Subjective  Brooke Lindsey is a 61 y.o. female who presents for the following: FBSE (The patient presents for Total-Body Skin Exam (TBSE) for skin cancer screening and mole check.  The patient has spots, moles and lesions to be evaluated, some may be new or changing and the patient has concerns that these could be cancer. Patient with hx of BCC, AK's and Grovers.).  Patient uses halobetasol as needed at chest when Grovers is flared.   The following portions of the chart were reviewed this encounter and updated as appropriate:   Tobacco  Allergies  Meds  Problems  Med Hx  Surg Hx  Fam Hx      Review of Systems:  No other skin or systemic complaints except as noted in HPI or Assessment and Plan.  Objective  Well appearing patient in no apparent distress; mood and affect are within normal limits.  A full examination was performed including scalp, head, eyes, ears, nose, lips, neck, chest, axillae, abdomen, back, buttocks, bilateral upper extremities, bilateral lower extremities, hands, feet, fingers, toes, fingernails, and toenails. All findings within normal limits unless otherwise noted below.  mid chest Slightly scaly pink papules  right cheek Verrucous papules -- Discussed viral etiology and contagion.     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 Currently well-controlled.  Continue halobetasol foam twice daily for up to 2 weeks as needed for flares. Avoid applying to face, groin, and axilla. Use as directed. Risk of skin atrophy with long-term use reviewed.   Topical steroids (such as triamcinolone, fluocinolone, fluocinonide, mometasone, clobetasol, halobetasol, betamethasone, hydrocortisone) can cause thinning and lightening of the skin if they are used for too long in the same area. Your physician has selected the right strength medicine for your problem and area affected on the  body. Please use your medication only as directed by your physician to prevent side effects.    Viral warts, unspecified type right cheek  Discussed viral etiology and risk of spread.  Discussed multiple treatments may be required to clear warts.  Discussed possible post-treatment dyspigmentation and risk of recurrence.  Prior to procedure, discussed risks of blister formation, small wound, skin dyspigmentation, or rare scar following cryotherapy. Recommend Vaseline ointment to treated areas while healing.   Destruction of lesion - right cheek  Destruction method: cryotherapy   Informed consent: discussed and consent obtained   Lesion destroyed using liquid nitrogen: Yes   Cryotherapy cycles:  2 Outcome: patient tolerated procedure well with no complications   Post-procedure details: wound care instructions given    Lentigines - Scattered tan macules - Due to sun exposure - Benign-appearing, observe - Recommend daily broad spectrum sunscreen SPF 30+ to sun-exposed areas, reapply every 2 hours as needed. - Call for any changes  Seborrheic Keratoses - Stuck-on, waxy, tan-brown papules and/or plaques  - Benign-appearing - Discussed benign etiology and prognosis. - Observe - Call for any changes  Melanocytic Nevi - Tan-brown and/or pink-flesh-colored symmetric macules and papules - Benign appearing on exam today - Observation - Call clinic for new or changing moles - Recommend daily use of broad spectrum spf 30+ sunscreen to sun-exposed areas.   Hemangiomas - Red papules - Discussed benign nature - Observe - Call for any changes  Actinic Damage - Chronic condition, secondary to cumulative UV/sun exposure - diffuse scaly erythematous macules with underlying dyspigmentation - Recommend daily broad spectrum sunscreen SPF 30+ to  sun-exposed areas, reapply every 2 hours as needed.  - Staying in the shade or wearing long sleeves, sun glasses (UVA+UVB protection) and wide brim  hats (4-inch brim around the entire circumference of the hat) are also recommended for sun protection.  - Call for new or changing lesions.  Skin cancer screening performed today.  Return for 4-6 weeks for wart, 6-12 months TBSE.  Graciella Belton, RMA, am acting as scribe for Forest Gleason, MD .  Documentation: I have reviewed the above documentation for accuracy and completeness, and I agree with the above.  Forest Gleason, MD

## 2022-04-27 ENCOUNTER — Encounter: Payer: Self-pay | Admitting: Dermatology

## 2022-05-21 ENCOUNTER — Ambulatory Visit: Payer: BC Managed Care – PPO | Admitting: Dermatology

## 2022-05-21 ENCOUNTER — Encounter: Payer: Self-pay | Admitting: Dermatology

## 2022-05-21 DIAGNOSIS — B078 Other viral warts: Secondary | ICD-10-CM

## 2022-05-21 NOTE — Progress Notes (Signed)
   Follow-Up Visit   Subjective  Brooke Lindsey is a 61 y.o. female who presents for the following: Warts (Right cheek. 6 week recheck. Patient reports small amount of wart is still there).  The following portions of the chart were reviewed this encounter and updated as appropriate:  Tobacco  Allergies  Meds  Problems  Med Hx  Surg Hx  Fam Hx      Review of Systems: No other skin or systemic complaints except as noted in HPI or Assessment and Plan.   Objective  Well appearing patient in no apparent distress; mood and affect are within normal limits.  A focused examination was performed including face. Relevant physical exam findings are noted in the Assessment and Plan.  Right Cheek x1 Verrucous papule   Assessment & Plan  Other viral warts Right Cheek x1  Discussed viral etiology and risk of spread.  Discussed multiple treatments may be required to clear warts.  Discussed possible post-treatment dyspigmentation and risk of recurrence.  Destruction of lesion - Right Cheek x1  Destruction method: chemical removal   Informed consent: discussed and consent obtained   Timeout:  patient name, date of birth, surgical site, and procedure verified Chemical destruction method comment:  Cantharidin plus Procedure instructions: patient instructed to wash and dry area   Procedure instructions comment:  Wash off in 2 hours Outcome: patient tolerated procedure well with no complications   Post-procedure details: wound care instructions given   Additional details:  Cantharidin Plus is a blistering agent that comes from a beetle.  It needs to be washed off in about 4 hours after application.  Although it is painless when applied in office, it may cause symptoms of mild pain and burning several hours later.  Treated areas will swell and turn red, and blisters may form.  Vaseline and a bandaid may be applied until wound has healed.  Once healed, the skin may remain temporarily discolored.  It  can take weeks to months for pigmentation to return to normal.  Advised to wash off with soap and water in 2 hours or sooner if it becomes tender before then.    Return for TBSE As Scheduled.  I, Emelia Salisbury, CMA, am acting as scribe for Forest Gleason, MD.  Documentation: I have reviewed the above documentation for accuracy and completeness, and I agree with the above.  Forest Gleason, MD

## 2022-05-21 NOTE — Patient Instructions (Addendum)
Instructions for After In-Office Application of Cantharidin  1. This is a strong medicine; please follow ALL instructions.  2. Gently wash off with soap and water in two hours or sooner s directed by your physician.  3. **WARNING** this medicine can cause severe blistering, blood blisters, infection, and/or scarring if it is not washed off as directed.  4. Your progress will be rechecked in 1-2 months; call sooner if there are any questions or problems.   Cantharidin Plus is a blistering agent that comes from a beetle.  It needs to be washed off in about 4 hours after application.  Although it is painless when applied in office, it may cause symptoms of mild pain and burning several hours later.  Treated areas will swell and turn red, and blisters may form.  Vaseline and a bandaid may be applied until wound has healed.  Once healed, the skin may remain temporarily discolored.  It can take weeks to months for pigmentation to return to normal.  Advised to wash off with soap and water in 2 hours or sooner if it becomes tender before then.    Due to recent changes in healthcare laws, you may see results of your pathology and/or laboratory studies on MyChart before the doctors have had a chance to review them. We understand that in some cases there may be results that are confusing or concerning to you. Please understand that not all results are received at the same time and often the doctors may need to interpret multiple results in order to provide you with the best plan of care or course of treatment. Therefore, we ask that you please give [place] 2 business days to thoroughly review all your results before contacting the office for clarification. Should we see a critical lab result, you will be contacted sooner.   If You Need Anything After Your Visit  If you have any questions or concerns for your doctor, please call our main line at [phone] and press option 4 to reach your doctor's medical  assistant. If no one answers, please leave a voicemail as directed and we will return your call as soon as possible. Messages left after 4 pm will be answered the following business day.   You may also send [place] a message via [facility]. We typically respond to MyChart messages within 1-2 business days.  For prescription refills, please ask your pharmacy to contact our office. Our fax number is [phone].  If you have an urgent issue when the clinic is closed that cannot wait until the next business day, you can page your doctor at the number below.    Please note that while we do our best to be available for urgent issues outside of office hours, we are not available 24/7.   If you have an urgent issue and are unable to reach [place], you may choose to seek medical care at your doctor's office, retail clinic, urgent care center, or emergency room.  If you have a medical emergency, please immediately call 911 or go to the emergency department.  Pager Numbers  - Dr. Nehemiah Massed: [phone]  - Dr. Laurence Ferrari: [phone]  - Dr. Nicole Kindred: [phone]  In the event of inclement weather, please call our main line at [phone] for an update on the status of any delays or closures.  Dermatology Medication Tips: Please keep the boxes that topical medications come in in order to help keep track of the instructions about where and how to use these. Pharmacies typically print  only on the boxes and not directly on the medication tubes.   If your medication is too expensive, please contact our office at [phone] option 4 or send us a message through MyChart.   We are unable to tell what your co-pay for medications will be in advance as this is different depending on your insurance coverage. However, we may be able to find a substitute medication at lower cost or fill out paperwork to get insurance to cover a needed medication.   If a prior authorization is required to get your medication  covered by your insurance company, please allow us 1-2 business days to complete this process.  Drug prices often vary depending on where the prescription is filled and some pharmacies may offer cheaper prices.  The website www.goodrx.com contains coupons for medications through different pharmacies. The prices here do not account for what the cost may be with help from insurance (it may be cheaper with your insurance), but the website can give you the price if you did not use any insurance.  - You can print the associated coupon and take it with your prescription to the pharmacy.  - You may also stop by our office during regular business hours and pick up a GoodRx coupon card.  - If you need your prescription sent electronically to a different pharmacy, notify our office through [facility] MyChart or by phone at [phone] option 4.     Si Usted Necesita Algo Despus de Su Visita  Tambin puede enviarnos un mensaje a travs de MyChart. Por lo general respondemos a los mensajes de MyChart en el transcurso de 1 a 2 das hbiles.  Para renovar recetas, por favor pida a su farmacia que se ponga en contacto con nuestra oficina. Nuestro nmero de fax es el [phone].  Si tiene un asunto urgente cuando la clnica est cerrada y que no puede esperar hasta el siguiente da hbil, puede llamar/localizar a su doctor(a) al nmero que aparece a continuacin.   Por favor, tenga en cuenta que aunque hacemos todo lo posible para estar disponibles para asuntos urgentes fuera del horario de oficina, no estamos disponibles las 24 horas del da, los 7 das de la semana.   Si tiene un problema urgente y no puede comunicarse con nosotros, puede optar por buscar atencin mdica  en el consultorio de su doctor(a), en una clnica privada, en un centro de atencin urgente o en una sala de emergencias.  Si tiene una emergencia mdica, por favor llame inmediatamente al 911 o vaya a la sala de  emergencias.  Nmeros de bper  - Dr. Kowalski: [phone]  - Dra. Moye: [phone]  - Dra. Stewart: [phone]  En caso de inclemencias del tiempo, por favor llame a nuestra lnea principal al [phone] para una actualizacin sobre el estado de cualquier retraso o cierre.  Consejos para la medicacin en dermatologa: Por favor, guarde las cajas en las que vienen los medicamentos de uso tpico para ayudarle a seguir las instrucciones sobre dnde y cmo usarlos. Las farmacias generalmente imprimen las instrucciones del medicamento slo en las cajas y no directamente en los tubos del medicamento.   Si su medicamento es muy caro, por favor, pngase en contacto con nuestra oficina llamando al [phone] y presione la opcin 4 o envenos un mensaje a travs de MyChart.   No podemos decirle cul ser su copago por los medicamentos por adelantado ya que esto es diferente dependiendo de la cobertura de su seguro. Sin embargo, es   posible que podamos encontrar un medicamento sustituto a menor costo o llenar un formulario para que el seguro cubra el medicamento que se considera necesario.   Si se requiere una autorizacin previa para que su compaa de seguros cubra su medicamento, por favor permtanos de 1 a 2 das hbiles para completar este proceso.  Los precios de los medicamentos varan con frecuencia dependiendo del lugar de dnde se surte la receta y alguna farmacias pueden ofrecer precios ms baratos.  El sitio web www.goodrx.com tiene cupones para medicamentos de diferentes farmacias. Los precios aqu no tienen en cuenta lo que podra costar con la ayuda del seguro (puede ser ms barato con su seguro), pero el sitio web puede darle el precio si no utiliz ningn seguro.  - Puede imprimir el cupn correspondiente y llevarlo con su receta a la farmacia.  - Tambin puede pasar por nuestra oficina durante el horario de atencin regular y recoger una tarjeta de cupones de GoodRx.  - Si  necesita que su receta se enve electrnicamente a una farmacia diferente, informe a nuestra oficina a travs de MyChart de [facility] o por telfono llamando al [phone] y presione la opcin 4.  

## 2022-05-22 ENCOUNTER — Encounter: Payer: Self-pay | Admitting: Dermatology

## 2022-06-23 ENCOUNTER — Telehealth (INDEPENDENT_AMBULATORY_CARE_PROVIDER_SITE_OTHER): Payer: BC Managed Care – PPO | Admitting: Family Medicine

## 2022-06-23 ENCOUNTER — Encounter: Payer: Self-pay | Admitting: Family Medicine

## 2022-06-23 VITALS — Temp 96.5°F | Ht 60.0 in | Wt 150.0 lb

## 2022-06-23 DIAGNOSIS — J324 Chronic pansinusitis: Secondary | ICD-10-CM

## 2022-06-23 MED ORDER — FLUTICASONE PROPIONATE 50 MCG/ACT NA SUSP: 2.0000 | Freq: Every day | NASAL | 0 refills | Status: DC

## 2022-06-23 MED ORDER — CULTURELLE PROBIOTICS + MULTIV PO CHEW: CHEWABLE_TABLET | ORAL | 0 refills | Status: DC

## 2022-06-23 MED ORDER — DOXYCYCLINE HYCLATE 100 MG PO TABS: 100.0000 mg | ORAL_TABLET | Freq: Two times a day (BID) | ORAL | 0 refills | Status: AC

## 2022-06-23 NOTE — Progress Notes (Signed)
Virtual Visit via Video note  I connected with Brooke Lindsey on 07/08/22 at 1625 by video and verified that I am speaking with the correct person using two identifiers. Brooke Lindsey is currently located at home and  is currently alone during visit. The provider, Carollee Leitz, MD is located in their office at time of visit.  I discussed the limitations, risks, security and privacy concerns of performing an evaluation and management service by video and the availability of in person appointments. I also discussed with the patient that there may be a patient responsible charge related to this service. The patient expressed understanding and agreed to proceed.  Subjective: PCP: [name], [name], MD  Chief Complaint  Patient presents with   Acute Visit    Nasal congestion Can not Breath @ night  Right ear pops a lot  Fatigue since last week of October Multiple Negative Covid Tests     Patient presents to clinic for possible sinus infection.  Symptoms started last week of October.  Has not gotten any better.  Has nasal congestion and pain on right side of face.  Worse at night.  Denies any cough, chest pain, fevers or recent sick contacts.  Has taken Alka-Seltzer sinus, cold and flu, nasal sprays that have all helped a little but symptoms returned after short period of time.  Also endorses feeling that her right ear needs to pop a lot.    ROS: Per HPI  Current Outpatient Medications:    fluticasone (FLONASE) 50 MCG/ACT nasal spray, Place 2 sprays into both nostrils daily., Disp: 16 g, Rfl: 0   latanoprost (XALATAN) 0.005 % ophthalmic solution, Place 1 drop into both eyes at bedtime., Disp: , Rfl:    Multiple Vitamins-Minerals (CULTURELLE PROBIOTICS + MULTIV) CHEW, Take 1 tablet daily (Patient not taking: Reported on 06/23/2022), Disp: 90 tablet, Rfl: 0  Observations/Objective: Physical Exam Pulmonary:     Effort: Pulmonary effort is normal.  Neurological:     Mental Status: She is  alert and oriented to person, place, and time. Mental status is at baseline.  Psychiatric:        Mood and Affect: Mood normal.        Behavior: Behavior normal.        Thought Content: Thought content normal.        Judgment: Judgment normal.    Assessment and Plan: Chronic pansinusitis Assessment & Plan: Suspect flare of chronic sinusitis.  Reviewed CT maxillofacial from 07/20/2005 noting patient had chronic sinus inflammation at that time with bony nasal septum position slightly to the right. Will treat with antibiotics and nasal decongestants.  Orders: -     Doxycycline Hyclate; Take 1 tablet (100 mg total) by mouth 2 (two) times daily for 5 days. (Patient not taking: Reported on 06/23/2022)  Dispense: 10 tablet; Refill: 0 -     Culturelle Probiotics + Multiv; Take 1 tablet daily (Patient not taking: Reported on 06/23/2022)  Dispense: 90 tablet; Refill: 0 -     Fluticasone Propionate; Place 2 sprays into both nostrils daily.  Dispense: 16 g; Refill: 0    Follow Up Instructions: Return if symptoms worsen or fail to improve.   I discussed the assessment and treatment plan with the patient. The patient was provided an opportunity to ask questions and all were answered. The patient agreed with the plan and demonstrated an understanding of the instructions.   The patient was advised to call back or seek an in-person evaluation if the symptoms  worsen or if the condition fails to improve as anticipated.  The above assessment and management plan was discussed with the patient. The patient verbalized understanding of and has agreed to the management plan. Patient is aware to call the clinic if symptoms persist or worsen. Patient is aware when to return to the clinic for a follow-up visit. Patient educated on when it is appropriate to go to the emergency department.   Time call ended: 1645  I provided 20 minutes of face-to-face time during this encounter.  Carollee Leitz, MD

## 2022-06-23 NOTE — Patient Instructions (Signed)
It was a pleasure meeting you today. Thank you for allowing me to take part in your health care.  Our goals for today as we discussed include:  For your sinusitis -Start doxycycline 100 mg twice a day x5 days -Start probiotics tablet daily and continue for at least 2 weeks after completion of antibiotics. -Start Flonase 2 sprays each nostril daily -Tylenol 325-500 mg every 6 hours as needed -Ibuprofen twice a day for 5 days every 8 hours as needed -Humidified air  Stay well hydrated Rest as needed with frequent repositioning and ambulation.  Increase activity as soon as tolerated to help with recovery.  Please follow-up with PCP as needed or if symptoms worsen  If you have any questions or concerns, please do not hesitate to call the office at [phone].  I look forward to our next visit and until then take care and stay safe.  Regards,   Carollee Leitz, MD   [facility]

## 2022-07-08 ENCOUNTER — Encounter: Payer: Self-pay | Admitting: Family Medicine

## 2022-07-08 DIAGNOSIS — J329 Chronic sinusitis, unspecified: Secondary | ICD-10-CM | POA: Insufficient documentation

## 2022-07-08 NOTE — Assessment & Plan Note (Signed)
Suspect flare of chronic sinusitis.  Reviewed CT maxillofacial from 07/20/2005 noting patient had chronic sinus inflammation at that time with bony nasal septum position slightly to the right. Will treat with antibiotics and nasal decongestants.

## 2022-07-21 ENCOUNTER — Other Ambulatory Visit: Payer: Self-pay | Admitting: Family Medicine

## 2022-07-21 DIAGNOSIS — J324 Chronic pansinusitis: Secondary | ICD-10-CM

## 2022-08-14 ENCOUNTER — Ambulatory Visit (INDEPENDENT_AMBULATORY_CARE_PROVIDER_SITE_OTHER): Payer: BC Managed Care – PPO

## 2022-08-14 ENCOUNTER — Ambulatory Visit: Payer: BC Managed Care – PPO | Admitting: Podiatry

## 2022-08-14 DIAGNOSIS — T1490XA Injury, unspecified, initial encounter: Secondary | ICD-10-CM | POA: Diagnosis not present

## 2022-08-14 DIAGNOSIS — S90122A Contusion of left lesser toe(s) without damage to nail, initial encounter: Secondary | ICD-10-CM | POA: Diagnosis not present

## 2022-08-17 NOTE — Progress Notes (Signed)
Chief Complaint  Patient presents with   Foot Injury    Stubbed left foot, 3rd digit    HPI: 62 y.o. female presenting today as a new patient for evaluation of an injury that the patient sustained to her left third digit.  She states that she stubbed her toe and was concerned for toe fracture.  This happened yesterday, 08/13/2022.  She presents for further treatment and evaluation  Past Medical History:  Diagnosis Date   Actinic keratosis 03/20/2020   right distal calf   Anemia    when younger   Arthritis    left foot and leg   ASCUS with positive high risk HPV cervical    h/i ca in situ 01/20/06, ascus 12/29/17, lsil 7/2/1, endometrial ablation 10/27/07   Basal cell carcinoma 03/23/2018   left medial inf shin/superficial   Basal cell carcinoma 03/20/2020   left medial inf shin   Benign neoplasm of ascending colon    Benign neoplasm of sigmoid colon    Complication of anesthesia    difficult waking up   Compression fracture of C-spine (HCC)    C4-C5 - 30 yrs ago - no limitations or issues   GERD (gastroesophageal reflux disease)    not recently   Glaucoma    Hypothyroidism    in past - no meds or issues over 5 yrs   Left knee pain    03/2022 in [facility] Dr. Theda Sers did [place] shot + arthritis   Mitral valve prolapse    followed by PCP   Mitral valve prolapse    no issues   Scoliosis    mild - lower back   Special screening for malignant neoplasms, colon    Wears contact lenses     Past Surgical History:  Procedure Laterality Date   CESAREAN SECTION     x 2   COLONOSCOPY     COLONOSCOPY WITH PROPOFOL N/A 10/21/2015   Procedure: COLONOSCOPY WITH PROPOFOL;  Surgeon: Lucilla Lame, MD;  Location: [facility];  Service: Endoscopy;  Laterality: N/A;   COLONOSCOPY WITH PROPOFOL N/A 03/31/2019   Procedure: COLONOSCOPY WITH BIOPSY;  Surgeon: Lucilla Lame, MD;  Location: [facility];  Service: Endoscopy;  Laterality: N/A;   COLPOSCOPY     FOOT SURGERY  Left    MANDIBLE SURGERY     POLYPECTOMY  10/21/2015   Procedure: POLYPECTOMY;  Surgeon: Lucilla Lame, MD;  Location: [facility];  Service: Endoscopy;;   POLYPECTOMY N/A 03/31/2019   Procedure: POLYPECTOMY;  Surgeon: Lucilla Lame, MD;  Location: [facility];  Service: Endoscopy;  Laterality: N/A;  Clip x1 placed in Sigmoid Colon    Allergies  Allergen Reactions   Hydrocodone Nausea And Vomiting   Penicillins Hives and Itching   Sulfa Antibiotics Nausea And Vomiting   Erythromycin     Other reaction(s): GI Intolerance     Physical Exam: General: The patient is alert and oriented x3 in no acute distress.  Dermatology: Skin is warm, dry and supple bilateral lower extremities.  No wounds.  There is some contusion around the base of the nail plate left third toe.  Vascular: Palpable pedal pulses bilaterally. Capillary refill within normal limits.  Mild edema noted to the distal portion of the left third toe with contusion at the base of the nail plate  Neurological: Light touch and protective threshold grossly intact  Musculoskeletal Exam: No pedal deformities noted  Radiographic Exam LT foot 08/14/2022:  Normal osseous mineralization. Joint spaces  preserved.  No fracture or cortical irregularities noted.  There is some slight toe deformity noted between the third and fourth digits  Assessment: 1.  Contusion left third toe 2.  Slight toe deformity third and fourth digits left.  Patient states this is chronic and unrelated to her injury  Plan of Care:  1. Patient evaluated. X-Rays reviewed.  2.  Recommend Motrin 800 mg as needed 3.  Patient has pain with ambulation.  Postsurgical shoe dispensed.  WBAT x 2-3 weeks until symptoms improve 4.  Return to clinic as needed       Edrick Kins, DPM [facility]  Dr. Edrick Kins, DPM    [address], [place] [address]                Office [phone]   Fax [phone]

## 2022-10-13 NOTE — Patient Instructions (Signed)
It was a pleasure meeting you today. Thank you for allowing me to take part in your health care.  Our goals for today as we discussed include:  Recommend Shingles vaccine.  This is a 2 dose series and can be given at your local pharmacy.  Please talk to your pharmacist about this.   Referral sent for Mammogram. Please call to schedule appointment. [facility] [place], [place] [address] [phone]   Schedule annual physical in August Schedule appointment for blood work 1 week before office visit. Fast for 10 hours  If you have any questions or concerns, please do not hesitate to call the office at [phone].  I look forward to our next visit and until then take care and stay safe.  Regards,   Carollee Leitz, MD   [facility]

## 2022-10-13 NOTE — Progress Notes (Signed)
error 

## 2022-10-14 ENCOUNTER — Ambulatory Visit: Payer: BC Managed Care – PPO | Admitting: Family Medicine

## 2022-10-14 ENCOUNTER — Encounter: Payer: Self-pay | Admitting: Family Medicine

## 2022-10-14 VITALS — BP 100/60 | HR 79 | Temp 97.9°F | Ht 61.0 in | Wt 150.6 lb

## 2022-10-14 DIAGNOSIS — E663 Overweight: Secondary | ICD-10-CM

## 2022-10-14 DIAGNOSIS — Z538 Procedure and treatment not carried out for other reasons: Secondary | ICD-10-CM

## 2022-10-19 ENCOUNTER — Encounter: Payer: Self-pay | Admitting: Family Medicine

## 2022-10-19 ENCOUNTER — Ambulatory Visit (INDEPENDENT_AMBULATORY_CARE_PROVIDER_SITE_OTHER): Payer: BC Managed Care – PPO | Admitting: Family Medicine

## 2022-10-19 VITALS — BP 110/70 | HR 76 | Temp 98.0°F | Ht 61.0 in | Wt 152.8 lb

## 2022-10-19 DIAGNOSIS — E663 Overweight: Secondary | ICD-10-CM

## 2022-10-19 DIAGNOSIS — I341 Nonrheumatic mitral (valve) prolapse: Secondary | ICD-10-CM | POA: Diagnosis not present

## 2022-10-19 DIAGNOSIS — H40053 Ocular hypertension, bilateral: Secondary | ICD-10-CM | POA: Diagnosis not present

## 2022-10-19 DIAGNOSIS — E785 Hyperlipidemia, unspecified: Secondary | ICD-10-CM

## 2022-10-19 DIAGNOSIS — R7303 Prediabetes: Secondary | ICD-10-CM | POA: Diagnosis not present

## 2022-10-19 NOTE — Progress Notes (Signed)
   SUBJECTIVE:   Chief Complaint  Patient presents with  . Transitions Of Care    Labs/ weight issues   HPI Patient presents to clinic to transfer care.  Weight management Frustrated with increasing weight.  Had injured left knee and was unable to continue with walking.  Weight has steadily increased.  Has recently restarted walking but does not feel that she is able to decrease weight.  PERTINENT PMH / PSH: MVP Hyperlipidemia Ocular hypertension   OBJECTIVE:  BP 110/70   Pulse 76   Temp 98 F (36.7 C) (Oral)   Ht 5\' 1"  (1.549 m)   Wt 152 lb 12.8 oz (69.3 kg)   SpO2 97%   BMI 28.87 kg/m    Physical Exam Constitutional:      General: She is not in acute distress.    Appearance: She is normal weight. She is not ill-appearing.  HENT:     Head: Normocephalic.  Eyes:     Conjunctiva/sclera: Conjunctivae normal.  Neck:     Thyroid: No thyromegaly or thyroid tenderness.  Cardiovascular:     Rate and Rhythm: Normal rate and regular rhythm.     Pulses: Normal pulses.  Pulmonary:     Effort: Pulmonary effort is normal.     Breath sounds: Normal breath sounds.  Abdominal:     General: Bowel sounds are normal.  Neurological:     Mental Status: She is alert. Mental status is at baseline.  Psychiatric:        Mood and Affect: Mood normal.        Behavior: Behavior normal.        Thought Content: Thought content normal.        Judgment: Judgment normal.    ASSESSMENT/PLAN:  There are no diagnoses linked to this encounter.  HCM Mammogram UTD Colonoscopy 2020.  7 m polyp.  Recommend f/u 5 yrs. Due 2025 Dexa scan 2019.  Normal PAP 2023. NILM, HPV negative.  Due 2026.  Follows with OBGYN Tetanus UTD due 2028 Recommend Shingles    PDMP reviewed***  Return in about 2 months (around 12/19/2022) for PCP.  Carollee Leitz, MD

## 2022-10-19 NOTE — Patient Instructions (Addendum)
It was a pleasure meeting you today. Thank you for allowing me to take part in your health care.  Our goals for today as we discussed include:  Recommend journaling your food and liquid intake for the next 2 weeks.  This includes everything from the time you wake up to the time you go to bed during your days that your record.  Recommend switching your activity to include resistance training and cardio.  If you would like a dietitian consult to discuss nutrition and protein options please MyChart me and we will send referral   Look at [facility] Well[facility] website to see if this may be an option for you with your weight loss. [facility] [place] [phone]   Recommend Shingles vaccine.  This is a 2 dose series and can be given at your local pharmacy.  Please talk to your pharmacist about this.   Colonoscopy is due in 2025  Follow-up in 2 months  If you have any questions or concerns, please do not hesitate to call the office at [phone].  I look forward to our next visit and until then take care and stay safe.  Regards,   Carollee Leitz, MD   [facility]

## 2022-10-22 ENCOUNTER — Encounter: Payer: Self-pay | Admitting: Family Medicine

## 2022-10-22 DIAGNOSIS — E663 Overweight: Secondary | ICD-10-CM | POA: Insufficient documentation

## 2022-10-22 DIAGNOSIS — R7303 Prediabetes: Secondary | ICD-10-CM | POA: Insufficient documentation

## 2022-10-22 NOTE — Assessment & Plan Note (Signed)
Chronic. Weight steadily increased since left knee injury.  Has been modifying diet and slowly started to exercise.  Frustrated with increasing weight and not being able to lose.  Long discussion surrounding nutrition to include increasing protein with each meal, not skipping meals, increasing water intake. Recommend journaling food intake for the next 2 to 3 weeks.  Can review at next visit. Initiated discussion on GLP-1 and other medications.  Open to continue discussion.  Would prefer to hold off on medications for now. Plan to continue discussions in future surrounding weight loss.

## 2022-10-22 NOTE — Assessment & Plan Note (Signed)
Chronic.  Stable. Continue to monitor.

## 2022-10-22 NOTE — Assessment & Plan Note (Signed)
Recent A1c 5.7. Weight increased since left knee injury.  Now with improvement in knee pain has started exercising slowly.  Has been trying to modify diet as well. Encouraged to continue healthy diet and exercise Check A1c annually

## 2022-10-22 NOTE — Assessment & Plan Note (Signed)
Chronic.  Stable. Continue latanoprost 0.005% 1 drop both eyes nightly Follow-up with Dr. Sabra Heck, ophthalmology q. 3 months as scheduled.

## 2022-10-22 NOTE — Assessment & Plan Note (Addendum)
Chronic.  Significant risk factors.  Recent LDL 114 Discussed modifying diet and increase activity.  With knee pain improving could consider low impact exercise to include water aerobics, cycling and resistance training.

## 2022-10-29 ENCOUNTER — Encounter: Payer: Self-pay | Admitting: Family Medicine

## 2022-12-21 ENCOUNTER — Ambulatory Visit: Payer: BC Managed Care – PPO | Admitting: Family Medicine

## 2022-12-21 VITALS — BP 128/78 | HR 64 | Ht 61.0 in | Wt 151.0 lb

## 2022-12-21 DIAGNOSIS — E663 Overweight: Secondary | ICD-10-CM

## 2022-12-21 DIAGNOSIS — Z6828 Body mass index (BMI) 28.0-28.9, adult: Secondary | ICD-10-CM | POA: Diagnosis not present

## 2022-12-21 LAB — COMPREHENSIVE METABOLIC PANEL
ALT: 16 U/L (ref 0–35)
AST: 22 U/L (ref 0–37)
Albumin: 4.4 g/dL (ref 3.5–5.2)
Alkaline Phosphatase: 47 U/L (ref 39–117)
BUN: 11 mg/dL (ref 6–23)
CO2: 28 mEq/L (ref 19–32)
Calcium: 9.4 mg/dL (ref 8.4–10.5)
Chloride: 101 mEq/L (ref 96–112)
Creatinine, Ser: 0.82 mg/dL (ref 0.40–1.20)
GFR: 76.86 mL/min (ref >60.00)
Glucose, Bld: 86 mg/dL (ref 70–99)
Potassium: 4.1 mEq/L (ref 3.5–5.1)
Sodium: 139 mEq/L (ref 135–145)
Total Bilirubin: 0.7 mg/dL (ref 0.2–1.2)
Total Protein: 6.8 g/dL (ref 6.0–8.3)

## 2022-12-21 LAB — VITAMIN B12: Vitamin B-12: 213 pg/mL (ref 211–911)

## 2022-12-21 LAB — CBC WITH DIFFERENTIAL/PLATELET
Basophils Absolute: 0 10*3/uL (ref 0.0–0.1)
Basophils Relative: 0.7 % (ref 0.0–3.0)
Eosinophils Absolute: 0.1 10*3/uL (ref 0.0–0.7)
Eosinophils Relative: 1.6 % (ref 0.0–5.0)
HCT: 39.3 % (ref 36.0–46.0)
Hemoglobin: 13.3 g/dL (ref 12.0–15.0)
Lymphocytes Relative: 31.2 % (ref 12.0–46.0)
Lymphs Abs: 1.8 10*3/uL (ref 0.7–4.0)
MCHC: 33.8 g/dL (ref 30.0–36.0)
MCV: 91 fl (ref 78.0–100.0)
Monocytes Absolute: 0.4 10*3/uL (ref 0.1–1.0)
Monocytes Relative: 7.7 % (ref 3.0–12.0)
Neutro Abs: 3.3 10*3/uL (ref 1.4–7.7)
Neutrophils Relative %: 58.8 % (ref 43.0–77.0)
Platelets: 290 10*3/uL (ref 150.0–400.0)
RBC: 4.32 Mil/uL (ref 3.87–5.11)
RDW: 13.3 % (ref 11.5–15.5)
WBC: 5.6 10*3/uL (ref 4.0–10.5)

## 2022-12-21 LAB — TSH: TSH: 3.03 u[IU]/mL (ref 0.35–5.50)

## 2022-12-21 LAB — LIPID PANEL
Cholesterol: 217 mg/dL — ABNORMAL HIGH (ref 0–200)
HDL: 82.5 mg/dL (ref >39.00)
LDL Cholesterol: 111 mg/dL — ABNORMAL HIGH (ref 0–99)
NonHDL: 134.9
Total CHOL/HDL Ratio: 3
Triglycerides: 120 mg/dL (ref 0.0–149.0)
VLDL: 24 mg/dL (ref 0.0–40.0)

## 2022-12-21 LAB — VITAMIN D 25 HYDROXY (VIT D DEFICIENCY, FRACTURES): VITD: 31.68 ng/mL (ref 30.00–100.00)

## 2022-12-21 LAB — HEMOGLOBIN A1C: Hgb A1c MFr Bld: 5.7 % (ref 4.6–6.5)

## 2022-12-21 NOTE — Progress Notes (Signed)
   SUBJECTIVE:   Chief Complaint  Patient presents with   Medical Management of Chronic Issues   HPI Patient presents to clinic to discuss  Weight management Down 1 pound since last visit.  Tried journaling meals, noticed not much protein intake.  Enjoys mostly salads.  Would like more structured program.  Would prefer to hold off on medications.  Had stopped journaling meals secondary to hectic lifestyle and family concerns.  Has increased activity to 10,000 steps daily.  Continues to be frustrated with lack of weight loss.  Feels not eating enough.  PERTINENT PMH / PSH: MVP Hyperlipidemia Ocular hypertension Overweight BMI greater than 25  OBJECTIVE:  BP 128/78   Pulse 64   Ht 5\' 1"  (1.549 m)   Wt 151 lb (68.5 kg)   SpO2 98%   BMI 28.53 kg/m    Physical Exam Vitals reviewed.  Constitutional:      General: She is not in acute distress.    Appearance: Normal appearance. She is not ill-appearing, toxic-appearing or diaphoretic.  Eyes:     General:        Right eye: No discharge.        Left eye: No discharge.     Conjunctiva/sclera: Conjunctivae normal.  Cardiovascular:     Rate and Rhythm: Normal rate and regular rhythm.     Heart sounds: Normal heart sounds.  Pulmonary:     Effort: Pulmonary effort is normal.     Breath sounds: Normal breath sounds.  Abdominal:     General: Bowel sounds are normal.  Skin:    General: Skin is warm.  Neurological:     Mental Status: She is alert and oriented to person, place, and time. Mental status is at baseline.  Psychiatric:        Mood and Affect: Mood normal.        Behavior: Behavior normal.        Thought Content: Thought content normal.        Judgment: Judgment normal.     ASSESSMENT/PLAN:  Overweight (BMI 25.0-29.9) Assessment & Plan: Chronic. Referral sent to healthy weight and wellness in [place] Continue current diet and activity. Labs today  Orders: -     Hemoglobin A1c -     Lipid panel -     CBC  with Differential/Platelet -     TSH -     Vitamin B12 -     VITAMIN D 25 Hydroxy (Vit-D Deficiency, Fractures) -     Comprehensive metabolic panel -     Amb Ref to Medical Weight Management   HCM Mammogram UTD. Recent 10/16/22.  Request report. Colonoscopy 2020.  7 m polyp.  Recommend f/u 5 yrs. Due 2025 Dexa scan 2019.  Normal PAP 2023. NILM, HPV negative.  Due 2026.  Follows with [facility] Tetanus UTD due 2028 Recommend Shingles   PDMP reviewed  Return if symptoms worsen or fail to improve.  Dana Allan, MD

## 2022-12-21 NOTE — Patient Instructions (Addendum)
It was a pleasure seeing you today. Thank you for allowing me to take part in your health care.  Our goals for today as we discussed include:  Look at Healthy [facility] Wellness website to see if this may be an option for you with your weight loss. [facility] [address] [place] [phone]   We will get some labs today.  If they are abnormal or we need to do something about them, I will call you.  If they are normal, I will send you a message on MyChart (if it is active) or a letter in the mail.  If you don't hear from [place] in 2 weeks, please call the office at the number below.   If you have any questions or concerns, please do not hesitate to call the office at [phone].  I look forward to our next visit and until then take care and stay safe.  Regards,   Dana Allan, MD   [facility]

## 2022-12-21 NOTE — Assessment & Plan Note (Signed)
Chronic. Referral sent to healthy weight and wellness in [place] Continue current diet and activity. Labs today

## 2023-01-03 ENCOUNTER — Telehealth: Payer: Self-pay | Admitting: Family Medicine

## 2023-01-06 NOTE — Telephone Encounter (Signed)
err

## 2023-01-07 NOTE — Telephone Encounter (Signed)
Request sent via Epic.

## 2023-01-13 ENCOUNTER — Encounter: Payer: BC Managed Care – PPO | Admitting: Family Medicine

## 2023-04-29 ENCOUNTER — Encounter: Payer: BC Managed Care – PPO | Admitting: Dermatology

## 2023-05-20 ENCOUNTER — Ambulatory Visit: Payer: BC Managed Care – PPO | Admitting: Dermatology

## 2023-05-20 ENCOUNTER — Encounter: Payer: Self-pay | Admitting: Dermatology

## 2023-05-20 VITALS — BP 127/78

## 2023-05-20 DIAGNOSIS — L988 Other specified disorders of the skin and subcutaneous tissue: Secondary | ICD-10-CM

## 2023-05-20 DIAGNOSIS — L578 Other skin changes due to chronic exposure to nonionizing radiation: Secondary | ICD-10-CM

## 2023-05-20 DIAGNOSIS — D1801 Hemangioma of skin and subcutaneous tissue: Secondary | ICD-10-CM | POA: Diagnosis not present

## 2023-05-20 DIAGNOSIS — L814 Other melanin hyperpigmentation: Secondary | ICD-10-CM | POA: Diagnosis not present

## 2023-05-20 DIAGNOSIS — L821 Other seborrheic keratosis: Secondary | ICD-10-CM | POA: Diagnosis not present

## 2023-05-20 DIAGNOSIS — W908XXA Exposure to other nonionizing radiation, initial encounter: Secondary | ICD-10-CM

## 2023-05-20 DIAGNOSIS — D229 Melanocytic nevi, unspecified: Secondary | ICD-10-CM

## 2023-05-20 DIAGNOSIS — Z1283 Encounter for screening for malignant neoplasm of skin: Secondary | ICD-10-CM | POA: Diagnosis not present

## 2023-05-20 DIAGNOSIS — Z872 Personal history of diseases of the skin and subcutaneous tissue: Secondary | ICD-10-CM

## 2023-05-20 DIAGNOSIS — L820 Inflamed seborrheic keratosis: Secondary | ICD-10-CM | POA: Diagnosis not present

## 2023-05-20 DIAGNOSIS — Z85828 Personal history of other malignant neoplasm of skin: Secondary | ICD-10-CM

## 2023-05-20 NOTE — Progress Notes (Signed)
Follow-Up Visit   Subjective  Brooke Lindsey is a 62 y.o. 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 The patient has spots, moles and lesions to be evaluated, some may be new or changing and the patient may have concern these could be cancer.    The following portions of the chart were reviewed this encounter and updated as appropriate: medications, allergies, medical history  Review of Systems:  No other skin or systemic complaints except as noted in HPI or Assessment and Plan.  Objective  Well appearing patient in no apparent distress; mood and affect are within normal limits.  A full examination was performed including scalp, head, eyes, ears, nose, lips, neck, chest, axillae, abdomen, back, buttocks, bilateral upper extremities, bilateral lower extremities, hands, feet, fingers, toes, fingernails, and toenails. All findings within normal limits unless otherwise noted below.   Relevant physical exam findings are noted in the Assessment and Plan.  L lower mid leg     L post leg       R chest x 1 Stuck on waxy paps with erythema    Assessment & Plan   SKIN CANCER SCREENING PERFORMED TODAY.  ACTINIC DAMAGE - Chronic condition, secondary to cumulative UV/sun exposure - diffuse scaly erythematous macules with underlying dyspigmentation - Recommend daily broad spectrum sunscreen SPF 30+ to sun-exposed areas, reapply every 2 hours as needed.  - Staying in the shade or wearing long sleeves, sun glasses (UVA+UVB protection) and wide brim hats (4-inch brim around the entire circumference of the hat) are also recommended for sun protection.  - Call for new or changing  lesions.  LENTIGINES, SEBORRHEIC KERATOSES, HEMANGIOMAS - Benign normal skin lesions - Benign-appearing - Call for any changes - SK back, benign  MELANOCYTIC NEVI - Tan-brown and/or pink-flesh-colored symmetric macules and papules - Benign appearing on exam today - Observation - Call clinic for new or changing moles - Recommend daily use of broad spectrum spf 30+ sunscreen to sun-exposed areas.   HISTORY OF BASAL CELL CARCINOMA OF THE SKIN - No evidence of recurrence today - Recommend regular full body skin exams - Recommend daily broad spectrum sunscreen SPF 30+ to sun-exposed areas, reapply every 2 hours as needed.  - Call if any new or changing lesions are noted between office visits  - L medial inf shin  WATCHING: BCC vs SCAR L lower mid leg, L post leg Exam: 5.26mm pink macule slight scale, similar on L post leg  Treatment Plan: Discussed observation vs BX, pt prefers to observe  HISTORY of GROVER'S DISEASE chest Exam: chest clear today  Treatment Plan: No treatment at this time    Inflamed seborrheic keratosis R chest x 1  Symptomatic, irritating, patient would like treated.  If not resolved in a month plan bx  Destruction of lesion - R chest x 1 Complexity: simple   Destruction method: cryotherapy   Informed consent: discussed and consent obtained   Timeout:  patient name, date of birth, surgical site, and procedure verified Lesion destroyed using liquid nitrogen: Yes   Region frozen until ice ball extended beyond lesion: Yes   Cryo cycles: 1 or 2. Outcome: patient tolerated procedure well with no complications  Post-procedure details: wound care instructions given    Multiple benign nevi  Seborrheic keratoses  Lentigines  Cherry angioma  Actinic elastosis   Return in about 6 months (around 11/18/2023) for recheck BCC vs scar x 2, recheck ISK.  I, Sonya Hupman, RMA, am acting as scribe for Elie Goody, MD .   Documentation: I have  reviewed the above documentation for accuracy and completeness, and I agree with the above.  Elie Goody, MD

## 2023-05-20 NOTE — Patient Instructions (Addendum)
Cryotherapy Aftercare  Wash gently with soap and water everyday.   Apply Vaseline and Band-Aid daily until healed.   Seborrheic Keratosis  What causes seborrheic keratoses? Seborrheic keratoses are harmless, common skin growths that first appear during adult life.  As time goes by, more growths appear.  Some people may develop a large number of them.  Seborrheic keratoses appear on both covered and uncovered body parts.  They are not caused by sunlight.  The tendency to develop seborrheic keratoses can be inherited.  They vary in color from skin-colored to gray, brown, or even black.  They can be either smooth or have a rough, warty surface.   Seborrheic keratoses are superficial and look as if they were stuck on the skin.  Under the microscope this type of keratosis looks like layers upon layers of skin.  That is why at times the top layer may seem to fall off, but the rest of the growth remains and re-grows.    Treatment Seborrheic keratoses do not need to be treated, but can easily be removed in the office.  Seborrheic keratoses often cause symptoms when they rub on clothing or jewelry.  Lesions can be in the way of shaving.  If they become inflamed, they can cause itching, soreness, or burning.  Removal of a seborrheic keratosis can be accomplished by freezing, burning, or surgery. If any spot bleeds, scabs, or grows rapidly, please return to have it checked, as these can be an indication of a skin cancer.  Due to recent changes in healthcare laws, you may see results of your pathology and/or laboratory studies on MyChart before the doctors have had a chance to review them. We understand that in some cases there may be results that are confusing or concerning to you. Please understand that not all results are received at the same time and often the doctors may need to interpret multiple results in order to provide you with the best plan of care or course of treatment. Therefore, we ask that you  please give [place] 2 business days to thoroughly review all your results before contacting the office for clarification. Should we see a critical lab result, you will be contacted sooner.   If You Need Anything After Your Visit  If you have any questions or concerns for your doctor, please call our main line at [phone] and press option 4 to reach your doctor's medical assistant. If no one answers, please leave a voicemail as directed and we will return your call as soon as possible. Messages left after 4 pm will be answered the following business day.   You may also send [place] a message via MyChart. We typically respond to MyChart messages within 1-2 business days.  For prescription refills, please ask your pharmacy to contact our office. Our fax number is [phone].  If you have an urgent issue when the clinic is closed that cannot wait until the next business day, you can page your doctor at the number below.    Please note that while we do our best to be available for urgent issues outside of office hours, we are not available 24/7.   If you have an urgent issue and are unable to reach [place], you may choose to seek medical care at your doctor's office, retail clinic, urgent care center, or emergency room.  If you have a medical emergency, please immediately call 911 or go to the emergency department.  Pager Numbers  - Dr. Gwen Pounds: [phone]  -  Dr. Roseanne Reno: [phone]  - Dr. Katrinka Blazing: [phone]   In the event of inclement weather, please call our main line at [phone] for an update on the status of any delays or closures.  Dermatology Medication Tips: Please keep the boxes that topical medications come in in order to help keep track of the instructions about where and how to use these. Pharmacies typically print the medication instructions only on the boxes and not directly on the medication tubes.   If your medication is too expensive, please contact our office at  [phone] option 4 or send [place] a message through MyChart.   We are unable to tell what your co-pay for medications will be in advance as this is different depending on your insurance coverage. However, we may be able to find a substitute medication at lower cost or fill out paperwork to get insurance to cover a needed medication.   If a prior authorization is required to get your medication covered by your insurance company, please allow [place] 1-2 business days to complete this process.  Drug prices often vary depending on where the prescription is filled and some pharmacies may offer cheaper prices.  The website www.goodrx.com contains coupons for medications through different pharmacies. The prices here do not account for what the cost may be with help from insurance (it may be cheaper with your insurance), but the website can give you the price if you did not use any insurance.  - You can print the associated coupon and take it with your prescription to the pharmacy.  - You may also stop by our office during regular business hours and pick up a GoodRx coupon card.  - If you need your prescription sent electronically to a different pharmacy, notify our office through [facility] or by phone at [phone] option 4.     Si Usted Necesita Algo Despus de Su Visita  Tambin puede enviarnos un mensaje a travs de Clinical cytogeneticist. Por lo general respondemos a los mensajes de MyChart en el transcurso de 1 a 2 das hbiles.  Para renovar recetas, por favor pida a su farmacia que se ponga en contacto con nuestra oficina. Annie Sable de fax es [place] [phone].  Si tiene un asunto urgente cuando la clnica est cerrada y que no puede esperar hasta el siguiente da hbil, puede llamar/localizar a su doctor(a) al nmero que aparece a continuacin.   Por favor, tenga en cuenta que aunque hacemos todo lo posible para estar disponibles para asuntos urgentes fuera del horario de [place], no estamos  disponibles las 24 horas del da, los 7 [address]  [address] de la [place].   Si tiene un problema urgente y no puede comunicarse con nosotros, puede optar por buscar atencin mdica  en el consultorio de su doctor(a), en una clnica privada, en un centro de atencin urgente o en una sala de emergencias.  Si tiene Engineer, drilling, por favor llame inmediatamente al 911 o vaya a la sala de emergencias.  Nmeros de bper  - Dr. Gwen Pounds: [phone]  - Dra. Roseanne Reno: [phone]  - Dr. Katrinka Blazing: [phone]   En caso de inclemencias del tiempo, por favor llame a Lacy Duverney principal al [phone] para una actualizacin sobre el [place] de cualquier retraso o cierre.  Consejos para la medicacin en dermatologa: Por favor, guarde las cajas en las que vienen los medicamentos de uso tpico para ayudarle a seguir las instrucciones sobre dnde y cmo usarlos. Las farmacias generalmente imprimen las instrucciones del medicamento slo en las  cajas y no directamente en los tubos del medicamento.   Si su medicamento es muy caro, por favor, pngase en contacto con Rolm Gala llamando al [phone] y presione la opcin 4 o envenos un mensaje a travs de Clinical cytogeneticist.   No podemos decirle cul ser su copago por los medicamentos por adelantado ya que esto es diferente dependiendo de la cobertura de su seguro. Sin embargo, es posible que podamos encontrar un medicamento sustituto a Audiological scientist un formulario para que el seguro cubra el medicamento que se considera necesario.   Si se requiere una autorizacin previa para que su compaa de seguros [place] su medicamento, por favor permtanos de 1 a 2 das hbiles para completar [address]  Los precios de los medicamentos varan con frecuencia dependiendo del Environmental consultant de dnde se surte la receta y alguna farmacias pueden ofrecer precios ms baratos.  El sitio web www.goodrx.com tiene cupones para medicamentos de Health and safety inspector. Los precios aqu no  tienen en cuenta lo que podra costar con la ayuda del seguro (puede ser ms barato con su seguro), pero el sitio web puede darle el precio si no utiliz Tourist information centre manager.  - Puede imprimir el cupn correspondiente y llevarlo con su receta a la farmacia.  - Tambin puede pasar por nuestra oficina durante el horario de atencin regular y Education officer, museum una tarjeta de cupones de GoodRx.  - Si necesita que su receta se enve electrnicamente a una farmacia diferente, informe a nuestra oficina a travs de MyChart de [place] o por telfono llamando al [phone] y presione la opcin 4.

## 2023-08-10 ENCOUNTER — Telehealth: Payer: BC Managed Care – PPO | Admitting: Physician Assistant

## 2023-08-10 DIAGNOSIS — J019 Acute sinusitis, unspecified: Secondary | ICD-10-CM | POA: Diagnosis not present

## 2023-08-10 DIAGNOSIS — B9689 Other specified bacterial agents as the cause of diseases classified elsewhere: Secondary | ICD-10-CM | POA: Diagnosis not present

## 2023-08-10 MED ORDER — DOXYCYCLINE HYCLATE 100 MG PO TABS
100.0000 mg | ORAL_TABLET | Freq: Two times a day (BID) | ORAL | 0 refills | Status: DC
Start: 2023-08-10 — End: 2023-12-09

## 2023-08-10 NOTE — Progress Notes (Signed)

## 2023-11-25 ENCOUNTER — Ambulatory Visit: Payer: BC Managed Care – PPO | Admitting: Dermatology

## 2023-12-09 ENCOUNTER — Ambulatory Visit: Admitting: Dermatology

## 2023-12-09 ENCOUNTER — Encounter: Payer: Self-pay | Admitting: Dermatology

## 2023-12-09 DIAGNOSIS — C44719 Basal cell carcinoma of skin of left lower limb, including hip: Secondary | ICD-10-CM

## 2023-12-09 DIAGNOSIS — C4491 Basal cell carcinoma of skin, unspecified: Secondary | ICD-10-CM

## 2023-12-09 DIAGNOSIS — C44712 Basal cell carcinoma of skin of right lower limb, including hip: Secondary | ICD-10-CM

## 2023-12-09 DIAGNOSIS — Z79899 Other long term (current) drug therapy: Secondary | ICD-10-CM

## 2023-12-09 DIAGNOSIS — L820 Inflamed seborrheic keratosis: Secondary | ICD-10-CM | POA: Diagnosis not present

## 2023-12-09 MED ORDER — IMIQUIMOD 5 % EX CREA: TOPICAL_CREAM | CUTANEOUS | 2 refills | Status: AC

## 2023-12-09 NOTE — Patient Instructions (Addendum)

## 2023-12-09 NOTE — Progress Notes (Addendum)
   Follow-Up Visit   Subjective  Brooke Lindsey is a 63 y.o. female who presents for the following: BCC vs Scar L lower mid leg, L post leg 72m f/u to recheck, ISK 58m f/u R chest improved but not fully resolved The patient has spots, moles and lesions to be evaluated, some may be new or changing and the patient may have concern these could be cancer.   The following portions of the chart were reviewed this encounter and updated as appropriate: medications, allergies, medical history  Review of Systems:  No other skin or systemic complaints except as noted in HPI or Assessment and Plan.  Objective  Well appearing patient in no apparent distress; mood and affect are within normal limits.   A focused examination was performed of the following areas: Chest, left leg  Relevant exam findings are noted in the Assessment and Plan.            chest x 1 Residual stuck on waxy pap with erythema  Assessment & Plan   BCCs, chronic not at goal L lower mid leg, L post lower leg, R lower ant leg x 2 Exam: pink macules slight scale L lower mid leg, L post lower leg, R lower leg x 2 see photos  Treatment Plan: Low risk skin cancer, grows slowly, can make non healing wounds or disrupt nearby structures, spread is very rare Discussed Bx vs Topical Cream vs observation Start Imiquimod  5% cr qd 5 nights a week before bed and wash off in the morning to aa lower legs for 6 wks  Reviewed expected reaction when using imiquimod  cream, including irritation and mild inflammation and risk of erosions or more severe inflammation. Reviewed not to apply this in an area larger than 4 x 4 inches to avoid flu-like symptoms. Only a thin layer is required. Reviewed if too much irritation occurs, ensure application of only a thin layer and decrease frequency slightly to achieve a tolerable level of inflammation.     INFLAMED SEBORRHEIC KERATOSIS chest x 1 Symptomatic, irritating, patient would like  treated. Destruction of lesion - chest x 1 Complexity: simple   Destruction method: cryotherapy   Informed consent: discussed and consent obtained   Timeout:  patient name, date of birth, surgical site, and procedure verified Lesion destroyed using liquid nitrogen: Yes   Region frozen until ice ball extended beyond lesion: Yes   Cryo cycles: 1 or 2. Outcome: patient tolerated procedure well with no complications   Post-procedure details: wound care instructions given   BASAL CELL CARCINOMA (BCC), UNSPECIFIED SITE   MEDICATION MANAGEMENT    Return in about 2 months (around 02/08/2024) for recheck BCC vs scars x 4.  I, Sonya Hupman, RMA, am acting as scribe for Harris Liming, MD .   Documentation: I have reviewed the above documentation for accuracy and completeness, and I agree with the above.  Harris Liming, MD

## 2024-02-08 ENCOUNTER — Encounter: Payer: Self-pay | Admitting: Dermatology

## 2024-02-08 ENCOUNTER — Ambulatory Visit: Admitting: Dermatology

## 2024-02-08 DIAGNOSIS — C44719 Basal cell carcinoma of skin of left lower limb, including hip: Secondary | ICD-10-CM | POA: Diagnosis not present

## 2024-02-08 DIAGNOSIS — D485 Neoplasm of uncertain behavior of skin: Secondary | ICD-10-CM

## 2024-02-08 DIAGNOSIS — C44712 Basal cell carcinoma of skin of right lower limb, including hip: Secondary | ICD-10-CM

## 2024-02-08 DIAGNOSIS — Z85828 Personal history of other malignant neoplasm of skin: Secondary | ICD-10-CM

## 2024-02-08 DIAGNOSIS — D492 Neoplasm of unspecified behavior of bone, soft tissue, and skin: Secondary | ICD-10-CM

## 2024-02-08 DIAGNOSIS — L905 Scar conditions and fibrosis of skin: Secondary | ICD-10-CM | POA: Diagnosis not present

## 2024-02-08 NOTE — Progress Notes (Signed)
   Follow-Up Visit   Subjective  Brooke Lindsey is a 63 y.o. female who presents for the following: 2 month follow up to recheck BCC vs scars x 4 at lower legs. Patient has been using imiquimod  5 nights weekly x 4 weeks. Improved per patient but not completely resolved. Also an ISK at chest that was treated with LN2 but not resolved.   The patient has spots, moles and lesions to be evaluated, some may be new or changing and the patient may have concern these could be cancer.   The following portions of the chart were reviewed this encounter and updated as appropriate: medications, allergies, medical history  Review of Systems:  No other skin or systemic complaints except as noted in HPI or Assessment and Plan.  Objective  Well appearing patient in no apparent distress; mood and affect are within normal limits.   A focused examination was performed of the following areas: Legs, chest  Relevant exam findings are noted in the Assessment and Plan.  central chest 4 mm pink to brown papule  Assessment & Plan   BCCs, chronic, improved with imiquimod , not at goal L lower mid leg, L post lower leg, R lower ant leg x 2 Exam: pink macules slight scale L lower mid leg, L post lower leg, R lower leg x 2 see photos   Treatment Plan: Low risk skin cancer, grows slowly, can make non healing wounds or disrupt nearby structures, spread is very rare Continue Imiquimod  5% cr qd 5 nights a week before bed and wash off in the morning to aa lower legs for 2 wks   Reviewed expected reaction when using imiquimod  cream, including irritation and mild inflammation and risk of erosions or more severe inflammation. Reviewed not to apply this in an area larger than 4 x 4 inches to avoid flu-like symptoms. Only a thin layer is required. Reviewed if too much irritation occurs, ensure application of only a thin layer and decrease frequency slightly to achieve a tolerable level of inflammation.    NEOPLASM OF  UNCERTAIN BEHAVIOR OF SKIN central chest Skin / nail biopsy Type of biopsy: tangential   Informed consent: discussed and consent obtained   Timeout: patient name, date of birth, surgical site, and procedure verified   Procedure prep:  Patient was prepped and draped in usual sterile fashion Prep type:  Isopropyl alcohol Anesthesia: the lesion was anesthetized in a standard fashion   Anesthetic:  1% lidocaine  w/ epinephrine 1-100,000 buffered w/ 8.4% NaHCO3 Instrument used: DermaBlade   Hemostasis achieved with: pressure and aluminum chloride   Outcome: patient tolerated procedure well   Post-procedure details: sterile dressing applied and wound care instructions given   Dressing type: bandage and petrolatum   Specimen 1 - Surgical pathology Differential Diagnosis: SCC vs DF vs ISK  Check Margins: No Lesion post cryo for ISK HISTORY OF BASAL CELL CARCINOMA (BCC)    Return in about 3 months (around 05/10/2024) for TBSE, with Dr. Claudene, [facility].  LILLETTE Lonell Drones, RMA, am acting as scribe for Boneta Claudene, MD .   Documentation: I have reviewed the above documentation for accuracy and completeness, and I agree with the above.  Boneta Claudene, MD

## 2024-02-08 NOTE — Patient Instructions (Signed)

## 2024-02-10 ENCOUNTER — Ambulatory Visit: Payer: Self-pay | Admitting: Dermatology

## 2024-02-10 LAB — SURGICAL PATHOLOGY

## 2024-02-14 NOTE — Telephone Encounter (Signed)
-----   Message from Dexter sent at 02/10/2024  9:14 PM EDT ----- Diagnosis: central chest :       DERMAL SCAR- NO MALIGNACY IDENTIFIED.   Plan: none, sent mychart message ----- Message ----- From: Interface, [facility] Sent: 02/10/2024   5:27 PM EDT To: Boneta Sharps, MD

## 2024-02-14 NOTE — Telephone Encounter (Signed)
 Patient advised pathology showed benign scar, no treatment needed. Lonell RAMAN., RMA

## 2024-05-09 ENCOUNTER — Ambulatory Visit: Admitting: Dermatology

## 2024-06-01 ENCOUNTER — Encounter: Payer: Self-pay | Admitting: Dermatology

## 2024-06-01 ENCOUNTER — Ambulatory Visit: Admitting: Dermatology

## 2024-06-01 DIAGNOSIS — L814 Other melanin hyperpigmentation: Secondary | ICD-10-CM

## 2024-06-01 DIAGNOSIS — L578 Other skin changes due to chronic exposure to nonionizing radiation: Secondary | ICD-10-CM | POA: Diagnosis not present

## 2024-06-01 DIAGNOSIS — L821 Other seborrheic keratosis: Secondary | ICD-10-CM

## 2024-06-01 DIAGNOSIS — Z1283 Encounter for screening for malignant neoplasm of skin: Secondary | ICD-10-CM | POA: Diagnosis not present

## 2024-06-01 DIAGNOSIS — W908XXA Exposure to other nonionizing radiation, initial encounter: Secondary | ICD-10-CM | POA: Diagnosis not present

## 2024-06-01 DIAGNOSIS — Z85828 Personal history of other malignant neoplasm of skin: Secondary | ICD-10-CM

## 2024-06-01 DIAGNOSIS — D225 Melanocytic nevi of trunk: Secondary | ICD-10-CM

## 2024-06-01 DIAGNOSIS — D1801 Hemangioma of skin and subcutaneous tissue: Secondary | ICD-10-CM

## 2024-06-01 DIAGNOSIS — Z872 Personal history of diseases of the skin and subcutaneous tissue: Secondary | ICD-10-CM

## 2024-06-01 DIAGNOSIS — D2272 Melanocytic nevi of left lower limb, including hip: Secondary | ICD-10-CM

## 2024-06-01 DIAGNOSIS — D229 Melanocytic nevi, unspecified: Secondary | ICD-10-CM

## 2024-06-01 NOTE — Patient Instructions (Signed)

## 2024-06-01 NOTE — Progress Notes (Signed)
 Follow-Up Visit   Subjective  Brooke Lindsey is a 63 y.o. female who presents for the following: Skin Cancer Screening and Full Body Skin Exam hx of BCCs, Recheck BCCs L lower mid leg, L post lower leg, R lower ant leg, pt finished last 2 weeks of Imiquimod  with no reaction, total of 6wk course  The patient presents for Total-Body Skin Exam (TBSE) for skin cancer screening and mole check. The patient has spots, moles and lesions to be evaluated, some may be new or changing and the patient may have concern these could be cancer.    The following portions of the chart were reviewed this encounter and updated as appropriate: medications, allergies, medical history  Review of Systems:  No other skin or systemic complaints except as noted in HPI or Assessment and Plan.  Objective  Well appearing patient in no apparent distress; mood and affect are within normal limits.  A full examination was performed including scalp, head, eyes, ears, nose, lips, neck, chest, axillae, abdomen, back, buttocks, bilateral upper extremities, bilateral lower extremities, hands, feet, fingers, toes, fingernails, and toenails. All findings within normal limits unless otherwise noted below.   Relevant physical exam findings are noted in the Assessment and Plan. Exam of face limited by presence of make-up.    Assessment & Plan   SKIN CANCER SCREENING PERFORMED TODAY.  ACTINIC DAMAGE - Chronic condition, secondary to cumulative UV/sun exposure - diffuse scaly erythematous macules with underlying dyspigmentation - Recommend daily broad spectrum sunscreen SPF 30+ to sun-exposed areas, reapply every 2 hours as needed.  - Staying in the shade or wearing long sleeves, sun glasses (UVA+UVB protection) and wide brim hats (4-inch brim around the entire circumference of the hat) are also recommended for sun protection.  - Call for new or changing lesions.  LENTIGINES, SEBORRHEIC KERATOSES, HEMANGIOMAS - Benign normal  skin lesions - Benign-appearing - Call for any changes  MELANOCYTIC NEVI - Tan-brown and/or pink-flesh-colored symmetric macules and papules - Benign appearing on exam today - Observation - Call clinic for new or changing moles - Recommend daily use of broad spectrum spf 30+ sunscreen to sun-exposed areas.  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  - Call if any new or changing lesions are noted between office visits  -L medal inf shin, L distal shin  Probable hx of BCCs  L lower mid leg, L post lower leg, R lower ant leg Exam: clear today  Treatment Plan: Clear after total of 6 wks Imiquimod  treatment, observe for recurrence  HISTORY OF PRECANCEROUS ACTINIC KERATOSIS - site(s) of PreCancerous Actinic Keratosis clear today. - these may recur and new lesions may form requiring treatment to prevent transformation into skin cancer - observe for new or changing spots and contact [facility] for appointment if occur - photoprotection with sun protective clothing; sunglasses and broad spectrum sunscreen with SPF of at least 30 + and frequent self skin exams recommended - yearly exams by a dermatologist recommended for persons with history of PreCancerous Actinic Keratoses  MULTIPLE BENIGN NEVI   LENTIGINES   SEBORRHEIC KERATOSES  ACTINIC ELASTOSIS   CHERRY ANGIOMA   HISTORY OF BASAL CELL CARCINOMA (BCC)   Return in about 1 year (around 06/01/2025) for TBSE, Hx of BCC, Hx of AKs.  I, Grayce Saunas, RMA, am acting as scribe for Boneta Sharps, MD .   Documentation: I have reviewed the above  documentation for accuracy and completeness, and I agree with the above.  Boneta Sharps, MD

## 2024-08-17 ENCOUNTER — Telehealth: Payer: Self-pay

## 2024-08-17 NOTE — Telephone Encounter (Signed)
 Copied from CRM #[id number]. Topic: Appointments - Transfer of Care >> Aug 17, 2024 11:52 AM Jeoffrey H wrote: Pt is requesting to transfer FROM: Dr. Glenys Ferrari  Pt is requesting to transfer TO: NP Leron Glance Reason for requested transfer: Provider no longer in the office  It is the responsibility of the team the patient would like to transfer to (Dr. Glance) to reach out to the patient if for any reason this transfer is not acceptable.

## 2024-11-30 ENCOUNTER — Encounter: Admitting: Nurse Practitioner

## 2025-05-31 ENCOUNTER — Ambulatory Visit: Admitting: Dermatology
